# Patient Record
Sex: Female | Born: 1975 | Hispanic: No | Smoking: Never smoker
Health system: Southern US, Community
[De-identification: ages and names within clinical notes are randomized; demographics above are authoritative.]

## PROBLEM LIST (undated history)

## (undated) DIAGNOSIS — F419 Anxiety disorder, unspecified: Secondary | ICD-10-CM

## (undated) DIAGNOSIS — R319 Hematuria, unspecified: Secondary | ICD-10-CM

## (undated) DIAGNOSIS — I729 Aneurysm of unspecified site: Secondary | ICD-10-CM

## (undated) DIAGNOSIS — I671 Cerebral aneurysm, nonruptured: Secondary | ICD-10-CM

## (undated) HISTORY — PX: CHOLECYSTECTOMY: SHX55

## (undated) HISTORY — DX: Anxiety disorder, unspecified: F41.9

## (undated) HISTORY — PX: APPENDECTOMY: SHX54

## (undated) HISTORY — DX: Hematuria, unspecified: R31.9

## (undated) HISTORY — PX: BRAIN SURGERY: SHX531

## (undated) HISTORY — DX: Aneurysm of unspecified site: I72.9

---

## 1998-04-11 ENCOUNTER — Emergency Department (HOSPITAL_COMMUNITY): Admission: EM | Admit: 1998-04-11 | Discharge: 1998-04-11 | Payer: Self-pay | Admitting: Emergency Medicine

## 1998-06-20 ENCOUNTER — Emergency Department (HOSPITAL_COMMUNITY): Admission: EM | Admit: 1998-06-20 | Discharge: 1998-06-20 | Payer: Self-pay | Admitting: Emergency Medicine

## 1999-09-23 ENCOUNTER — Emergency Department (HOSPITAL_COMMUNITY): Admission: EM | Admit: 1999-09-23 | Discharge: 1999-09-23 | Payer: Self-pay | Admitting: Emergency Medicine

## 1999-12-18 ENCOUNTER — Other Ambulatory Visit: Admission: RE | Admit: 1999-12-18 | Discharge: 1999-12-18 | Payer: Self-pay | Admitting: *Deleted

## 2000-10-26 ENCOUNTER — Other Ambulatory Visit: Admission: RE | Admit: 2000-10-26 | Discharge: 2000-10-26 | Payer: Self-pay | Admitting: Obstetrics and Gynecology

## 2000-10-26 ENCOUNTER — Encounter (INDEPENDENT_AMBULATORY_CARE_PROVIDER_SITE_OTHER): Payer: Self-pay | Admitting: Specialist

## 2001-04-29 ENCOUNTER — Other Ambulatory Visit: Admission: RE | Admit: 2001-04-29 | Discharge: 2001-04-29 | Payer: Self-pay | Admitting: Family Medicine

## 2001-07-16 ENCOUNTER — Encounter: Payer: Self-pay | Admitting: Emergency Medicine

## 2001-07-16 ENCOUNTER — Inpatient Hospital Stay (HOSPITAL_COMMUNITY): Admission: EM | Admit: 2001-07-16 | Discharge: 2001-07-18 | Payer: Self-pay | Admitting: Emergency Medicine

## 2001-07-17 ENCOUNTER — Encounter: Payer: Self-pay | Admitting: Emergency Medicine

## 2001-07-19 ENCOUNTER — Ambulatory Visit (HOSPITAL_COMMUNITY): Admission: RE | Admit: 2001-07-19 | Discharge: 2001-07-19 | Payer: Self-pay | Admitting: Gastroenterology

## 2001-07-19 ENCOUNTER — Encounter: Payer: Self-pay | Admitting: Gastroenterology

## 2001-08-03 ENCOUNTER — Encounter (INDEPENDENT_AMBULATORY_CARE_PROVIDER_SITE_OTHER): Payer: Self-pay

## 2001-08-03 ENCOUNTER — Observation Stay (HOSPITAL_COMMUNITY): Admission: RE | Admit: 2001-08-03 | Discharge: 2001-08-04 | Payer: Self-pay | Admitting: *Deleted

## 2003-03-27 ENCOUNTER — Ambulatory Visit (HOSPITAL_COMMUNITY): Admission: RE | Admit: 2003-03-27 | Discharge: 2003-03-27 | Payer: Self-pay | Admitting: Obstetrics and Gynecology

## 2003-04-14 ENCOUNTER — Encounter (INDEPENDENT_AMBULATORY_CARE_PROVIDER_SITE_OTHER): Payer: Self-pay | Admitting: Specialist

## 2003-04-14 ENCOUNTER — Ambulatory Visit (HOSPITAL_COMMUNITY): Admission: RE | Admit: 2003-04-14 | Discharge: 2003-04-14 | Payer: Self-pay | Admitting: Obstetrics and Gynecology

## 2003-09-18 ENCOUNTER — Other Ambulatory Visit: Admission: RE | Admit: 2003-09-18 | Discharge: 2003-09-18 | Payer: Self-pay | Admitting: Obstetrics and Gynecology

## 2004-01-17 ENCOUNTER — Ambulatory Visit (HOSPITAL_COMMUNITY): Admission: AD | Admit: 2004-01-17 | Discharge: 2004-01-17 | Payer: Self-pay | Admitting: Obstetrics and Gynecology

## 2004-03-18 ENCOUNTER — Inpatient Hospital Stay (HOSPITAL_COMMUNITY): Admission: AD | Admit: 2004-03-18 | Discharge: 2004-03-21 | Payer: Self-pay | Admitting: Obstetrics and Gynecology

## 2004-03-19 ENCOUNTER — Encounter (INDEPENDENT_AMBULATORY_CARE_PROVIDER_SITE_OTHER): Payer: Self-pay | Admitting: Specialist

## 2004-03-21 ENCOUNTER — Inpatient Hospital Stay (HOSPITAL_COMMUNITY): Admission: AD | Admit: 2004-03-21 | Discharge: 2004-03-21 | Payer: Self-pay | Admitting: Obstetrics and Gynecology

## 2005-02-12 ENCOUNTER — Other Ambulatory Visit: Admission: RE | Admit: 2005-02-12 | Discharge: 2005-02-12 | Payer: Self-pay | Admitting: Obstetrics and Gynecology

## 2006-04-10 ENCOUNTER — Other Ambulatory Visit: Admission: RE | Admit: 2006-04-10 | Discharge: 2006-04-10 | Payer: Self-pay | Admitting: Obstetrics and Gynecology

## 2007-06-17 ENCOUNTER — Inpatient Hospital Stay (HOSPITAL_COMMUNITY): Admission: AD | Admit: 2007-06-17 | Discharge: 2007-06-17 | Payer: Self-pay | Admitting: Obstetrics and Gynecology

## 2007-06-17 ENCOUNTER — Ambulatory Visit (HOSPITAL_COMMUNITY): Admission: RE | Admit: 2007-06-17 | Discharge: 2007-06-17 | Payer: Self-pay | Admitting: Obstetrics and Gynecology

## 2007-06-21 ENCOUNTER — Ambulatory Visit (HOSPITAL_COMMUNITY): Admission: RE | Admit: 2007-06-21 | Discharge: 2007-06-21 | Payer: Self-pay | Admitting: Obstetrics and Gynecology

## 2007-06-24 ENCOUNTER — Ambulatory Visit (HOSPITAL_COMMUNITY): Admission: RE | Admit: 2007-06-24 | Discharge: 2007-06-24 | Payer: Self-pay | Admitting: Obstetrics and Gynecology

## 2007-07-01 ENCOUNTER — Ambulatory Visit (HOSPITAL_COMMUNITY): Admission: RE | Admit: 2007-07-01 | Discharge: 2007-07-01 | Payer: Self-pay | Admitting: Obstetrics and Gynecology

## 2007-07-08 ENCOUNTER — Ambulatory Visit (HOSPITAL_COMMUNITY): Admission: RE | Admit: 2007-07-08 | Discharge: 2007-07-08 | Payer: Self-pay | Admitting: Obstetrics and Gynecology

## 2007-07-15 ENCOUNTER — Ambulatory Visit (HOSPITAL_COMMUNITY): Admission: RE | Admit: 2007-07-15 | Discharge: 2007-07-15 | Payer: Self-pay | Admitting: Obstetrics and Gynecology

## 2007-07-21 ENCOUNTER — Ambulatory Visit (HOSPITAL_COMMUNITY): Admission: RE | Admit: 2007-07-21 | Discharge: 2007-07-21 | Payer: Self-pay | Admitting: Obstetrics and Gynecology

## 2007-07-22 ENCOUNTER — Inpatient Hospital Stay (HOSPITAL_COMMUNITY): Admission: AD | Admit: 2007-07-22 | Discharge: 2007-07-22 | Payer: Self-pay | Admitting: Obstetrics and Gynecology

## 2007-07-28 ENCOUNTER — Ambulatory Visit (HOSPITAL_COMMUNITY): Admission: RE | Admit: 2007-07-28 | Discharge: 2007-07-28 | Payer: Self-pay | Admitting: Obstetrics and Gynecology

## 2007-08-04 ENCOUNTER — Encounter: Payer: Self-pay | Admitting: Obstetrics and Gynecology

## 2007-08-04 ENCOUNTER — Inpatient Hospital Stay (HOSPITAL_COMMUNITY): Admission: AD | Admit: 2007-08-04 | Discharge: 2007-08-09 | Payer: Self-pay | Admitting: Obstetrics and Gynecology

## 2007-08-09 ENCOUNTER — Encounter: Payer: Self-pay | Admitting: Obstetrics and Gynecology

## 2007-08-13 ENCOUNTER — Inpatient Hospital Stay (HOSPITAL_COMMUNITY): Admission: RE | Admit: 2007-08-13 | Discharge: 2007-08-15 | Payer: Self-pay | Admitting: Obstetrics and Gynecology

## 2007-08-13 ENCOUNTER — Encounter (INDEPENDENT_AMBULATORY_CARE_PROVIDER_SITE_OTHER): Payer: Self-pay | Admitting: Obstetrics and Gynecology

## 2009-12-20 ENCOUNTER — Emergency Department (HOSPITAL_BASED_OUTPATIENT_CLINIC_OR_DEPARTMENT_OTHER): Admission: EM | Admit: 2009-12-20 | Discharge: 2009-12-20 | Payer: Self-pay | Admitting: Emergency Medicine

## 2010-01-01 ENCOUNTER — Encounter: Admission: RE | Admit: 2010-01-01 | Discharge: 2010-01-01 | Payer: Self-pay | Admitting: Family Medicine

## 2010-01-02 ENCOUNTER — Emergency Department (HOSPITAL_BASED_OUTPATIENT_CLINIC_OR_DEPARTMENT_OTHER): Admission: EM | Admit: 2010-01-02 | Discharge: 2010-01-02 | Payer: Self-pay | Admitting: Emergency Medicine

## 2010-01-02 ENCOUNTER — Ambulatory Visit: Payer: Self-pay | Admitting: Diagnostic Radiology

## 2010-01-03 ENCOUNTER — Encounter: Payer: Self-pay | Admitting: Family Medicine

## 2010-01-16 ENCOUNTER — Inpatient Hospital Stay (HOSPITAL_COMMUNITY): Admission: RE | Admit: 2010-01-16 | Discharge: 2010-01-17 | Payer: Self-pay | Admitting: Interventional Radiology

## 2010-01-30 ENCOUNTER — Encounter: Payer: Self-pay | Admitting: Interventional Radiology

## 2010-03-11 ENCOUNTER — Ambulatory Visit (HOSPITAL_COMMUNITY): Admission: RE | Admit: 2010-03-11 | Discharge: 2010-03-12 | Payer: Self-pay | Admitting: Interventional Radiology

## 2010-03-25 ENCOUNTER — Encounter: Payer: Self-pay | Admitting: Interventional Radiology

## 2010-04-05 ENCOUNTER — Ambulatory Visit: Payer: Self-pay | Admitting: Psychiatry

## 2010-04-05 ENCOUNTER — Inpatient Hospital Stay (HOSPITAL_COMMUNITY): Admission: RE | Admit: 2010-04-05 | Discharge: 2010-04-09 | Payer: Self-pay | Admitting: Psychiatry

## 2010-06-12 ENCOUNTER — Ambulatory Visit (HOSPITAL_COMMUNITY)
Admission: RE | Admit: 2010-06-12 | Discharge: 2010-06-12 | Payer: Self-pay | Source: Home / Self Care | Admitting: Psychiatry

## 2010-06-14 ENCOUNTER — Emergency Department (HOSPITAL_COMMUNITY): Admission: EM | Admit: 2010-06-14 | Discharge: 2010-06-14 | Payer: Self-pay | Admitting: Emergency Medicine

## 2010-06-17 ENCOUNTER — Other Ambulatory Visit (HOSPITAL_COMMUNITY): Admission: RE | Admit: 2010-06-17 | Discharge: 2010-06-28 | Payer: Self-pay | Admitting: Psychiatry

## 2010-06-27 ENCOUNTER — Ambulatory Visit: Payer: Self-pay | Admitting: Psychiatry

## 2010-07-01 ENCOUNTER — Emergency Department (HOSPITAL_COMMUNITY)
Admission: EM | Admit: 2010-07-01 | Discharge: 2010-07-02 | Payer: Self-pay | Source: Home / Self Care | Admitting: Emergency Medicine

## 2010-07-01 ENCOUNTER — Ambulatory Visit (HOSPITAL_COMMUNITY)
Admission: RE | Admit: 2010-07-01 | Discharge: 2010-07-01 | Payer: Self-pay | Source: Home / Self Care | Admitting: Psychiatry

## 2010-07-02 DIAGNOSIS — F331 Major depressive disorder, recurrent, moderate: Secondary | ICD-10-CM

## 2010-07-16 ENCOUNTER — Ambulatory Visit (HOSPITAL_COMMUNITY): Admission: RE | Admit: 2010-07-16 | Discharge: 2010-07-16 | Payer: Self-pay | Admitting: Interventional Radiology

## 2010-09-15 ENCOUNTER — Encounter: Payer: Self-pay | Admitting: Obstetrics and Gynecology

## 2010-10-12 ENCOUNTER — Emergency Department (HOSPITAL_COMMUNITY)
Admission: EM | Admit: 2010-10-12 | Discharge: 2010-10-12 | Disposition: A | Discharge status: Home / Self Care | Payer: BC Managed Care – PPO | Attending: Emergency Medicine | Admitting: Emergency Medicine

## 2010-10-12 ENCOUNTER — Emergency Department (HOSPITAL_COMMUNITY): Payer: BC Managed Care – PPO

## 2010-10-12 DIAGNOSIS — N3010 Interstitial cystitis (chronic) without hematuria: Secondary | ICD-10-CM | POA: Insufficient documentation

## 2010-10-12 DIAGNOSIS — I671 Cerebral aneurysm, nonruptured: Secondary | ICD-10-CM | POA: Insufficient documentation

## 2010-10-12 DIAGNOSIS — R5381 Other malaise: Secondary | ICD-10-CM | POA: Insufficient documentation

## 2010-10-12 DIAGNOSIS — G43909 Migraine, unspecified, not intractable, without status migrainosus: Secondary | ICD-10-CM | POA: Insufficient documentation

## 2010-10-12 DIAGNOSIS — F411 Generalized anxiety disorder: Secondary | ICD-10-CM | POA: Insufficient documentation

## 2010-10-12 DIAGNOSIS — J45909 Unspecified asthma, uncomplicated: Secondary | ICD-10-CM | POA: Insufficient documentation

## 2010-10-12 DIAGNOSIS — R5383 Other fatigue: Secondary | ICD-10-CM | POA: Insufficient documentation

## 2010-10-12 DIAGNOSIS — Z79899 Other long term (current) drug therapy: Secondary | ICD-10-CM | POA: Insufficient documentation

## 2010-10-12 LAB — URINALYSIS, ROUTINE W REFLEX MICROSCOPIC
Ketones, ur: NEGATIVE mg/dL
Nitrite: NEGATIVE
Protein, ur: NEGATIVE mg/dL
Specific Gravity, Urine: 1.01 (ref 1.005–1.030)
Urine Glucose, Fasting: NEGATIVE mg/dL
pH: 7.5 (ref 5.0–8.0)

## 2010-10-12 LAB — COMPREHENSIVE METABOLIC PANEL
ALT: 15 U/L (ref 0–35)
Albumin: 3.3 g/dL — ABNORMAL LOW (ref 3.5–5.2)
BUN: 7 mg/dL (ref 6–23)
Chloride: 114 mEq/L — ABNORMAL HIGH (ref 96–112)
Creatinine, Ser: 0.77 mg/dL (ref 0.4–1.2)
GFR calc non Af Amer: >60 mL/min (ref >60)
Glucose, Bld: 106 mg/dL — ABNORMAL HIGH (ref 70–99)
Potassium: 3.6 mEq/L (ref 3.5–5.1)
Total Bilirubin: 0.8 mg/dL (ref 0.3–1.2)

## 2010-10-12 LAB — DIFFERENTIAL
Basophils Relative: 0 % (ref 0–1)
Eosinophils Absolute: 0.1 10*3/uL (ref 0.0–0.7)
Eosinophils Relative: 2 % (ref 0–5)
Lymphs Abs: 1.6 10*3/uL (ref 0.7–4.0)
Neutro Abs: 4.6 10*3/uL (ref 1.7–7.7)
Neutrophils Relative %: 69 % (ref 43–77)

## 2010-10-12 LAB — POCT I-STAT, CHEM 8
Calcium, Ion: 1.15 mmol/L (ref 1.12–1.32)
Chloride: 109 mEq/L (ref 96–112)
Creatinine, Ser: 0.9 mg/dL (ref 0.4–1.2)
HCT: 34 % — ABNORMAL LOW (ref 36.0–46.0)
Hemoglobin: 11.6 g/dL — ABNORMAL LOW (ref 12.0–15.0)
Potassium: 3.6 mEq/L (ref 3.5–5.1)
Sodium: 142 mEq/L (ref 135–145)
TCO2: 19 mmol/L (ref 0–100)

## 2010-10-12 LAB — URINE MICROSCOPIC-ADD ON

## 2010-10-12 LAB — RAPID URINE DRUG SCREEN, HOSP PERFORMED
Amphetamines: NOT DETECTED
Tetrahydrocannabinol: NOT DETECTED

## 2010-10-12 LAB — POCT PREGNANCY, URINE: Preg Test, Ur: NEGATIVE

## 2010-10-12 LAB — CBC
MCHC: 33.8 g/dL (ref 30.0–36.0)
Platelets: 219 10*3/uL (ref 150–400)
RBC: 3.77 MIL/uL — ABNORMAL LOW (ref 3.87–5.11)
RDW: 13 % (ref 11.5–15.5)

## 2010-10-12 LAB — ACETAMINOPHEN LEVEL: Acetaminophen (Tylenol), Serum: <10 ug/mL — ABNORMAL LOW (ref 10–30)

## 2010-11-05 LAB — PROTIME-INR: Prothrombin Time: 14 seconds (ref 11.6–15.2)

## 2010-11-05 LAB — APTT: aPTT: 30 s (ref 24–37)

## 2010-11-05 LAB — BASIC METABOLIC PANEL WITH GFR
BUN: 9 mg/dL (ref 6–23)
CO2: 20 meq/L (ref 19–32)
Calcium: 9.8 mg/dL (ref 8.4–10.5)
Chloride: 107 meq/L (ref 96–112)
Creatinine, Ser: 0.8 mg/dL (ref 0.4–1.2)
GFR calc non Af Amer: >60 mL/min
Glucose, Bld: 143 mg/dL — ABNORMAL HIGH (ref 70–99)
Potassium: 3.5 meq/L (ref 3.5–5.1)
Sodium: 138 meq/L (ref 135–145)

## 2010-11-05 LAB — CBC
Hemoglobin: 13.7 g/dL (ref 12.0–15.0)
MCH: 30.2 pg (ref 26.0–34.0)
MCHC: 34.2 g/dL (ref 30.0–36.0)
MCV: 89.2 fL (ref 78.0–100.0)
MCV: 86.5 fL (ref 78.0–100.0)
RBC: 4.25 MIL/uL (ref 3.87–5.11)
RBC: 4.53 MIL/uL (ref 3.87–5.11)
RDW: 13.6 % (ref 11.5–15.5)
RDW: 13.2 % (ref 11.5–15.5)
WBC: 8.2 10*3/uL (ref 4.0–10.5)

## 2010-11-05 LAB — RAPID URINE DRUG SCREEN, HOSP PERFORMED
Amphetamines: NOT DETECTED
Barbiturates: NOT DETECTED
Benzodiazepines: NOT DETECTED
Cocaine: NOT DETECTED
Opiates: NOT DETECTED
Tetrahydrocannabinol: NOT DETECTED

## 2010-11-05 LAB — POCT PREGNANCY, URINE: Preg Test, Ur: NEGATIVE

## 2010-11-05 LAB — BASIC METABOLIC PANEL
Calcium: 9.2 mg/dL (ref 8.4–10.5)
GFR calc non Af Amer: >60 mL/min (ref >60)

## 2010-11-05 LAB — ETHANOL

## 2010-11-06 LAB — POCT I-STAT, CHEM 8
BUN: 7 mg/dL (ref 6–23)
Calcium, Ion: 1.16 mmol/L (ref 1.12–1.32)
HCT: 38 % (ref 36.0–46.0)
Hemoglobin: 12.9 g/dL (ref 12.0–15.0)
Sodium: 141 mEq/L (ref 135–145)
TCO2: 26 mmol/L (ref 0–100)

## 2010-11-08 LAB — COMPREHENSIVE METABOLIC PANEL
ALT: 22 U/L (ref 0–35)
Albumin: 4.1 g/dL (ref 3.5–5.2)
BUN: 14 mg/dL (ref 6–23)
Calcium: 9.2 mg/dL (ref 8.4–10.5)
Glucose, Bld: 91 mg/dL (ref 70–99)
Potassium: 3.9 mEq/L (ref 3.5–5.1)
Sodium: 140 mEq/L (ref 135–145)
Total Protein: 7.2 g/dL (ref 6.0–8.3)

## 2010-11-08 LAB — CBC
HCT: 38.7 % (ref 36.0–46.0)
MCHC: 34.3 g/dL (ref 30.0–36.0)
Platelets: 271 10*3/uL (ref 150–400)
RDW: 12.9 % (ref 11.5–15.5)
WBC: 6.4 10*3/uL (ref 4.0–10.5)

## 2010-11-08 LAB — ETHANOL: Alcohol, Ethyl (B): <5 mg/dL (ref 0–10)

## 2010-11-08 LAB — TSH: TSH: 1.367 u[IU]/mL (ref 0.350–4.500)

## 2010-11-09 LAB — CBC
HCT: 27.8 % — ABNORMAL LOW (ref 36.0–46.0)
Hemoglobin: 9.4 g/dL — ABNORMAL LOW (ref 12.0–15.0)
RBC: 3.02 MIL/uL — ABNORMAL LOW (ref 3.87–5.11)

## 2010-11-09 LAB — BASIC METABOLIC PANEL
Calcium: 7.4 mg/dL — ABNORMAL LOW (ref 8.4–10.5)
GFR calc Af Amer: >60 mL/min (ref >60)
GFR calc non Af Amer: >60 mL/min (ref >60)
Potassium: 3.7 mEq/L (ref 3.5–5.1)
Sodium: 138 mEq/L (ref 135–145)

## 2010-11-09 LAB — PROTIME-INR
INR: 1.2 (ref 0.00–1.49)
Prothrombin Time: 15.1 seconds (ref 11.6–15.2)

## 2010-11-09 LAB — APTT: aPTT: 50 seconds — ABNORMAL HIGH (ref 24–37)

## 2010-11-09 LAB — HEPARIN LEVEL (UNFRACTIONATED): Heparin Unfractionated: 0.15 IU/mL — ABNORMAL LOW (ref 0.30–0.70)

## 2010-11-10 LAB — DIFFERENTIAL
Basophils Relative: 0 % (ref 0–1)
Lymphocytes Relative: 38 % (ref 12–46)
Lymphs Abs: 2.4 10*3/uL (ref 0.7–4.0)
Monocytes Relative: 6 % (ref 3–12)
Neutro Abs: 3.5 10*3/uL (ref 1.7–7.7)
Neutrophils Relative %: 54 % (ref 43–77)

## 2010-11-10 LAB — APTT: aPTT: 33 seconds (ref 24–37)

## 2010-11-10 LAB — BASIC METABOLIC PANEL
Calcium: 9.1 mg/dL (ref 8.4–10.5)
GFR calc Af Amer: >60 mL/min (ref >60)
GFR calc non Af Amer: >60 mL/min (ref >60)
Sodium: 139 mEq/L (ref 135–145)

## 2010-11-10 LAB — CBC
Hemoglobin: 13 g/dL (ref 12.0–15.0)
MCHC: 34.6 g/dL (ref 30.0–36.0)
RBC: 4.13 MIL/uL (ref 3.87–5.11)
WBC: 6.4 10*3/uL (ref 4.0–10.5)

## 2010-11-10 LAB — PROTIME-INR: INR: 1.14 (ref 0.00–1.49)

## 2010-11-11 LAB — BASIC METABOLIC PANEL
BUN: 8 mg/dL (ref 6–23)
CO2: 28 mEq/L (ref 19–32)
Calcium: 7.9 mg/dL — ABNORMAL LOW (ref 8.4–10.5)
Calcium: 9 mg/dL (ref 8.4–10.5)
Creatinine, Ser: 0.58 mg/dL (ref 0.4–1.2)
Creatinine, Ser: 0.62 mg/dL (ref 0.4–1.2)
GFR calc Af Amer: >60 mL/min (ref >60)
GFR calc Af Amer: >60 mL/min (ref >60)
Glucose, Bld: 83 mg/dL (ref 70–99)

## 2010-11-11 LAB — DIFFERENTIAL
Basophils Absolute: 0 10*3/uL (ref 0.0–0.1)
Basophils Relative: 0 % (ref 0–1)
Neutro Abs: 4.5 10*3/uL (ref 1.7–7.7)
Neutrophils Relative %: 59 % (ref 43–77)

## 2010-11-11 LAB — PROTIME-INR
INR: 1.13 (ref 0.00–1.49)
INR: 1.32 (ref 0.00–1.49)
Prothrombin Time: 14.4 seconds (ref 11.6–15.2)
Prothrombin Time: 16.3 seconds — ABNORMAL HIGH (ref 11.6–15.2)

## 2010-11-11 LAB — CBC
MCHC: 33.7 g/dL (ref 30.0–36.0)
Platelets: 198 10*3/uL (ref 150–400)
RBC: 3.35 MIL/uL — ABNORMAL LOW (ref 3.87–5.11)
RBC: 4.08 MIL/uL (ref 3.87–5.11)
RDW: 13.5 % (ref 11.5–15.5)
WBC: 13.3 10*3/uL — ABNORMAL HIGH (ref 4.0–10.5)

## 2010-11-12 LAB — BASIC METABOLIC PANEL
Calcium: 8.8 mg/dL (ref 8.4–10.5)
Creatinine, Ser: 0.7 mg/dL (ref 0.4–1.2)
GFR calc Af Amer: >60 mL/min (ref >60)
GFR calc non Af Amer: >60 mL/min (ref >60)
Glucose, Bld: 103 mg/dL — ABNORMAL HIGH (ref 70–99)
Sodium: 143 mEq/L (ref 135–145)

## 2010-11-12 LAB — CBC
Hemoglobin: 13 g/dL (ref 12.0–15.0)
MCHC: 34.3 g/dL (ref 30.0–36.0)
RBC: 4.2 MIL/uL (ref 3.87–5.11)
RDW: 12.2 % (ref 11.5–15.5)

## 2010-11-12 LAB — DIFFERENTIAL
Basophils Absolute: 0 10*3/uL (ref 0.0–0.1)
Basophils Relative: 0 % (ref 0–1)
Lymphocytes Relative: 29 % (ref 12–46)
Monocytes Absolute: 0.4 10*3/uL (ref 0.1–1.0)
Neutro Abs: 3.9 10*3/uL (ref 1.7–7.7)
Neutrophils Relative %: 62 % (ref 43–77)

## 2010-11-12 LAB — PROTIME-INR
INR: 1.02 (ref 0.00–1.49)
Prothrombin Time: 13.3 seconds (ref 11.6–15.2)

## 2010-11-12 LAB — HEMOGLOBIN AND HEMATOCRIT, BLOOD
HCT: 36.4 % (ref 36.0–46.0)
Hemoglobin: 12.6 g/dL (ref 12.0–15.0)

## 2010-11-12 LAB — PREGNANCY, URINE
Preg Test, Ur: NEGATIVE
Preg Test, Ur: NEGATIVE

## 2010-11-12 LAB — APTT: aPTT: 31 seconds (ref 24–37)

## 2011-01-10 NOTE — Discharge Summary (Signed)
NAME:  Katherine [name], Katherine [name]              ACCOUNT NO.:  [id number]   MEDICAL RECORD NO.:  [id number]          PATIENT TYPE:  INP   LOCATION:  9106                          FACILITY:  [facility]   PHYSICIAN:  Huel Cote, M.D. DATE OF BIRTH:  1975/09/23   DATE OF ADMISSION:  08/13/2007  DATE OF DISCHARGE:  08/15/2007                               DISCHARGE SUMMARY   DISCHARGE DIAGNOSES:  1. Term pregnancy at 37 weeks delivered.  2. Preeclampsia and asymmetrical intrauterine growth retardation.  3. Status post induction and delivery of infant.   DISCHARGE MEDICATIONS:  Motrin 600 mg p.o. q.6 h.   DISCHARGE FOLLOWUP:  The patient is to followup in the office for her  routine postpartum exam.   HOSPITAL COURSE:  The patient is 35 year old G3, P1-0-1-1 who was  admitted at 32 weeks' gestation with preeclampsia and findings  consistent with a fetal growth lag.  The patient had been being followed  by significant proteinuria in her urine with 24-hour urine 800 mg on  December 10 and 1400 mg on December 15.  She had been followed closely  with labs, blood pressures, and fetal monitoring; and down that she has  reached 37 weeks it is felt by maternal fetal medicine all involved that  delivery is prudent; and that the patient had no current PIH symptoms,  although she had prior to admission had some nausea and vomiting  intermittently.   PRENATAL LABS AS FOLLOWS:  A+ antibody negative, rubella equivocal, HIV  negative, hepatitis B surface antigen negative, RPR negative, GC  negative, chlamydia negative, group B strep negative.  A 1-hour Glucola  131.   PAST OBSTETRICAL HISTORY:  She had a vaginal delivery of a 6 pounds 4  ounce infant in 2005 with preeclampsia at 37 weeks, in 2004 a  miscarriage.   PAST GYN HISTORY:  None.   PAST MEDICAL HISTORY:  1,  History of anxiety.  1. Interstitial cystitis.  2. Migraines.   PAST SURGICAL HISTORY:  1. Tonsillectomy.  2. Appendectomy.  3.  Cholecystectomy.   ALLERGIES:  Multiple.   MEDICATIONS:  Prenatal vitamins and Zantac.   HOSPITAL COURSE:  On admission blood pressure was stable at 120/70.  PIH  labs were normal.  Cervix was 2 to 3 at a -2 station.  She had rupture  of membranes performed with clear fluid noted.  Last fetal ultrasound  had showed a growth of approximately 17th percentile.  She progressed in  labor, received an epidural, and pushed well with a normal spontaneous  vaginal delivery of a vigorous female infant, Apgars were 7 and 9, and  weight was 5 pounds 2 ounces.  Infant had a good spontaneous cry;  however, did develop some retractions and slightly decreased tone; and,  therefore, was taken to the observational nursery.   Placenta was slightly adherent and was delivered spontaneously, but some  trailing fragments were removed manually after delivery.  The patient  had a small hymenal ring defect which had been bothersome to her with  intercourse.  This was trimmed opened and two interrupted 2-0 Vicryl  sutures were placed  for hemostasis.  There is a small laceration under  the clitoris which was repaired with 3-0 interrupted suture for  hemostasis also.  There was no further bleeding after initial bleeding  from the placenta which was 400-500 mL.  Cervix and rectum were intact.  She was then admitted for routine postpartum care.  The hemoglobin was  noted to be 9.9.  The baby did have to remain in the NICU secondary to  turning dusky with feedings, and had an open soft palate noted; however,  was stable.   The patient on postpartum day #2 was felt stable for discharge home.  She was given emotional support regarding the baby remaining in the  NICU, and was instructed to call with any increase in postpartum  depression.      Huel Cote, M.D.  Electronically Signed     KR/MEDQ  D:  10/05/2007  T:  10/06/2007  Job:  [id number]

## 2011-01-10 NOTE — H&P (Signed)
NAME:  Katherine [name], Katherine [name]                        ACCOUNT NO.:  [id number]   MEDICAL RECORD NO.:  [id number]                   PATIENT TYPE:  AMB   LOCATION:  SDC                                  FACILITY:  [facility]   PHYSICIAN:  Huel Cote, M.D.              DATE OF BIRTH:  1976-07-15   DATE OF ADMISSION:  03/28/2003  DATE OF DISCHARGE:                                HISTORY & PHYSICAL   HISTORY OF PRESENT ILLNESS:  The patient is a 35 year old G1, P0 with a LMP  of February 01, 2003 who has been followed with first trimester bleeding and  serial beta HCG's as well as ultrasounds. The patient at 7 weeks had an  ultrasound with a 3 cm gestational sac noted but no fetal pole within it,  consistent with a blighted ovum. Given all options of expected management  versus preceding with a D&C, the patient wishes to proceed with a D&C.   PAST MEDICAL HISTORY:  Significant for migraine headaches and allergies.   PAST SURGICAL HISTORY:  Cholecystectomy in 2002. Appendectomy in 1996.  Tonsillectomy in 1980 and an ovarian cystectomy in 1992 and 1993.   PAST GYNECOLOGIC HISTORY:  No abnormal pap smears.  Her menstrual periods  have been irregular, approximately every 14 days in April, May and June.  However, had spread out to every 30 days for her last cycle.   ALLERGIES:  The patient is allergic to multiple medications including ZOMIG,  PERCOCET, CODEINE, HYDROCODONE, ULTRAM, DITROPAN, URISED AND PYRIDIUM.   PHYSICAL EXAMINATION:  HEENT: No abnormalities.  CARDIAC: Regular rate and rhythm.  LUNGS: Clear.  ABDOMEN: Soft and nontender.  PELVIC: On vaginal examination, she has normal external genitalia. Her  uterus size is consistent with 7-8 weeks and her cervical os is closed.   LABORATORY DATA:  The patient's beta HCG's have been drawn and followed on  March 03, 2003. She had a progesterone of 10.8 and a beta HCG of 646. On March 06, 2003 she had a followup beta of 3,505 and on March 13, 2003,  she had a  beta of 8,910. Beta drawn just prior to surgery was 43,000 and an ultrasound  revealed no fetal pole and an MD gestational sac.   HOSPITAL COURSE:  The patient was counseled of the risks and benefits of  proceeding with surgery including bleeding and possible uterine perforation.  The patient understands these risks. However, desires to proceed with  surgery as stated. Blood type confirms that she is Rh positive and her blood  count is stable.                                               Huel Cote, M.D.    KR/MEDQ  D:  03/27/2003  T:  03/27/2003  Job:  [id number]  

## 2011-01-10 NOTE — H&P (Signed)
[facility]  Patient:    Katherine [name], Katherine [name] Visit Number: [id number] MRN: [id number]          Service Type: MED Location: 3W 0375 01 Attending Physician:  Starr Sinclair Dictated by:   Sammuel Cooper, P.A. Admit Date:  07/16/2001   CC:         Katherine [name], M.D. [facility]   History and Physical  CHIEF COMPLAINT:  Acute abdominal pain, nausea and vomiting, and diarrhea.  HISTORY OF PRESENT ILLNESS:  Katherine [name] is a 35 year old white female with history of migraine headaches.  She also has had a history of ovarian cyst and required a left ovarian cystectomy in the 1980s.  She has been on long-term birth control pills since that time due to recurrent cysts.  She is status post appendectomy in 1992 which was done laparoscopically and has history of urethral stricture.  The patient reports intermittent episodes of nausea and vomiting and abdominal pain occurring every three to four months over the past several years.  She says she is generally sick about 24 hours with these episodes and then has gradual resolution over a couple of days.  The episodes are often associated with diarrhea.  The patient had a gallbladder workup about five months ago at [facility] with ultrasound and HIDA scan, both reportedly negative.  She says she feels fine in between these episodes.  She had onset approximately one week ago with nausea and vomiting which woke her up one night.  She reports that every morning since that time she has had nausea and has spit up a bit after getting up in the morning.  She says her bowel movements have been normal.  She then had onset of mid back pain bilaterally yesterday which was somewhat progressive.  Then last evening about 12:30 a.m. woke up with nausea and vomiting, had multiple episodes of vomiting, and onset of epigastric pain which has been constant.  This was associated with sweats and feeling of being hot and cold.   She had no documented temperature at home.  She did have diarrhea after coming to the emergency room for several episodes which has since subsided.  She has not noted any melena or hematochezia.  She has been evaluated in the emergency room per Dr. Beverely Pace today.  A noncontrasted CT was done for renal stone protocol secondary to finding of microscopic hematuria.  CT was negative for stone and there was a question of a soft tissue density in the area of the appendix, and even question of appendicitis; however, she is status post appendectomy.  LABORATORY:  WBC 10.8, hemoglobin 13.1, hematocrit 40.9, MCV 87.  Urine pregnancy test was negative.  Amylase 61, lipase 24.  Urinalysis showed 7-10 RBCs per high-powered field.  BMET unremarkable.  Total bilirubin 1.3, indirect bilirubin 1.1, SGOT 19, SGPT 16.  The patient is admitted at this time with persistent pain for further diagnostic evaluation and pain control.  CURRENT MEDICATIONS: 1. Verapamil 240 p.o. q.d. 2. Zyrtec 10 q.d. 3. Maxalt p.r.n. migraine. 4. Ketoprofen p.r.n. migraine. 5. Flonase nasal spray 1 spray each nostril q.d. 6. She takes an allergy injection q. week. 7. She is on birth control pills daily with a 21-day cycle with withdrawal    bleeding every three months.  ALLERGIES:  IODINE intolerant with contrast for a previous IVP with nausea and vomiting.  Intolerant to CODEINE, URISED, DITROPAN, PERDIEM, and ZOMIG with various reactions.  PAST MEDICAL HISTORY:  As outlined  above.  FAMILY HISTORY:  Pertinent for rotten gallbladders with gallbladder disease in mother and grandmother.  A grandfather with colitis which on further questioning is probably irritable bowel.  SOCIAL HISTORY:  The patient is married.  She is employed as a Runner, broadcasting/film/video.  She says she is hoping to start a family soon and was planning on coming off of birth control pills.  No tobacco and no ETOH.  REVIEW OF SYSTEMS:  CARDIOVASCULAR:  Negative.   PULMONARY:  Negative. GENITOURINARY:  Negative.  GI:  As above.  GYN:  The patient reports scant amount of vaginal bleeding yesterday.  She is at the beginning of her cycle with her pack of birth control pills and would not be expecting to have another menstrual cycle for three more weeks.  She is currently on a 21-day pack of birth control pills and only has a period every three months when she has withdrawal bleeding for one week.  PHYSICAL EXAMINATION:  VITAL SIGNS:  Temperature 99.4 on admission and 100.3 later this morning. Blood pressure 97/60, pulse 80s, and O2 saturation 97.  GENERAL:  Well-developed white female, ill-appearing, in no acute distress.  HEENT:  Normocephalic, atraumatic.  EOMI.  PERRLA.  Sclerae anicteric.  HEART:  Regular rate and rhythm with S1, S2.  PULMONARY:  Clear to auscultation and percussion.  ABDOMEN:  Soft.  She is tender in the epigastrium, right upper quadrant, and right mid quadrant.  There is no guarding or rebound, no mass or hepatosplenomegaly.  Bowel sounds present.  RECTAL:  Heme-negative per ER physician, not repeated.  EXTREMITIES:  Without clubbing, cyanosis, or edema.  IMPRESSION: 26. A 35 year old white female with recurrent episodes of abdominal pain and    nausea and vomiting.  Now with acute upper abdominal pain, nausea and    vomiting, and fever, etiology not clear.  Question soft tissue inflammatory    density on noncontrasted CT scan in right lower quadrant. 2. Status post appendectomy. 3. Status post ovarian cystectomy on left, remote. 4. History of migraines. 5. Allergies. 6. Microscopic hematuria with negative CT with stone protocol.  PLAN:  The patient is admitted to the service of Judie Petit T. Pleas Koch., M.D. for intravenous fluid hydration, pain control, antiemetics.  Will proceed with upper endoscopy later this afternoon for further diagnostic evaluation.  If this is negative, plan contrasted CT scan of the abdomen  and pelvis and then  if unrevealing, fat stimulated HIDA scan and further workup depending on results of above. Dictated by:   Sammuel Cooper, P.A. Attending Physician:  Starr Sinclair DD:  07/16/01 TD:  07/16/01 Job: [id number]

## 2011-01-10 NOTE — Discharge Summary (Signed)
NAME:  Katherine [name], Katherine [name]              ACCOUNT NO.:  [id number]   MEDICAL RECORD NO.:  [id number]          PATIENT TYPE:  INP   LOCATION:  9158                          FACILITY:  [facility]   PHYSICIAN:  Huel Cote, M.D. DATE OF BIRTH:  [birth year]   DATE OF ADMISSION:  08/04/2007  DATE OF DISCHARGE:  08/09/2007                               DISCHARGE SUMMARY   DISCHARGE DIAGNOSES:  1. Preterm pregnancy at 35-3/7 weeks undelivered.  2. Severe proteinuria with suspected preeclampsia.  3. Some asymmetrical growth restriction.  4. Preterm contractions.   DISCHARGE MEDICATIONS:  None   HOSPITAL COURSE:  The patient is a 35 year old G3, P 1-0-1-1 who was  admitted 35-3/[redacted] weeks gestation for a complaint of nausea and vomiting  with lower abdominal pain.  The patient had been being followed  carefully with a history of severe preeclampsia with her first pregnancy  and on this pregnancy was noted to have a 4+ proteinuria on dipstick,  blood pressures were reasonable at 120-130/70s-80s.  The patient denied  any headache.  Prenatal care had been complicated by asymmetrical growth  restriction with a significant abdominal circumference lag.  The patient  had been followed by Dopplers and biophysical profiles.  She also had  had some preterm labor which was treated with terbutaline and decreased  activity.  Her cervix was 1-2 cm dilated at that point.   PRENATAL LABORATORY DATA:  A negative, antibody negative, RPR  nonreactive, rubella equivocal, hepatitis B surface antigen negative,  HIV nonreactive, GC negative, chlamydia negative, group B strep  negative.   PAST OBSTETRICAL HISTORY:  1. In 2004 she had a miscarriage.  2. In 2005 she had a vaginal delivery of a 6 pound 4 ounce infant at      37 weeks with severe preeclampsia.   PAST GYNECOLOGICAL HISTORY:  No abnormal Pap smears.   PAST SURGICAL HISTORY:  1. Tonsillectomy.  2. Cholecystectomy.  3. Appendectomy.   PAST MEDICAL HISTORY:   Significant for  1. Irritable bowel syndrome.  2. Migraines.  3. Asthma.  4. Interstitial cystitis.  5. Anxiety.   On admission she was afebrile and blood pressure was stable at 130/80.  Cardiac was regular rate and rhythm.  Lungs were clear.  Abdomen was  gravid and nontender.  DTRs were normal at 1 to 2+ and edema was 1 to 2+  also.  Platelets were over 200,000.  Uric acid 4.2.  LFTs were normal at  22 and 13 and creatinine was 0.5.  She was brought in house and begun on  a 24-hour urine with blood pressure observation.  Her symptoms improved  and her fetal monitoring remained stable.  She did have some days on her  admission where she felt some nausea and mild epigastric pain.  Her 24-  hour urine returned with 819 mg of protein, significant with probably  mild to moderate preeclampsia.  Blood pressures remained normal.  After  discussion with maternal fetal medicine, it was agreed that the patient  should remain in-house which she did.  She was followed closely with pre-  eclamptic labs and fetal  monitoring and after five days of admission,  had a repeat 24-hour urine which did increase to 1400 mg of protein,  however, her labs remained completely stable.  The plan with maternal  fetal medicine was made to deliver her at 37 weeks unless any other  symptoms or changes occurred in her status other than the severe  proteinuria and it was felt that with close observation, she could be  safely followed at home with intense monitoring in the office.  She was  therefore discharged to home with follow-up in the office planned within  48 hours and will have repeat labs and monitoring at that time.      Huel Cote, M.D.  Electronically Signed     KR/MEDQ  D:  10/04/2007  T:  10/06/2007  Job:  [id number]

## 2011-01-10 NOTE — Op Note (Signed)
NAME:  Katherine [name], Katherine [name]                        ACCOUNT NO.:  [id number]   MEDICAL RECORD NO.:  [id number]                   PATIENT TYPE:  AMB   LOCATION:  DAY                                  FACILITY:  [facility]   PHYSICIAN:  Huel Cote, M.D.              DATE OF BIRTH:  09-13-1975   DATE OF PROCEDURE:  04/14/2003  DATE OF DISCHARGE:                                 OPERATIVE REPORT   PREOPERATIVE DIAGNOSIS:  Missed abortion at about eight weeks gestation.   POSTOPERATIVE DIAGNOSIS:  Missed abortion at about eight weeks gestation.   PROCEDURE:  Suction, dilation, and curettage.   SURGEON:  Huel Cote, M.D.   ANESTHESIA:  MAC.   FLUIDS:  1. Estimated blood loss 50 mL.  2. Urine output 100 mL clear straight cath prior to procedure.   FINDINGS:  Uterus was approximately 7-8 weeks in size.  Cervix was partially  dilated prior to procedure.  There were minimum to moderate products of  conception obtained.   DESCRIPTION OF PROCEDURE:  The patient was taken to the operating room where  [facility] anesthesia was obtained without difficulty.  She was then prepped and  draped in the normal sterile fashion in the dorsal lithotomy position.  A  speculum was placed within the vagina and the cervix identified, grasped  with a single-tooth tenaculum, and injected with 1% plain lidocaine in a  local block.  A uterine sound was then introduced easily into the uterine  cavity and sounded to approximately 8 cm.  Again, the cervix was noted to be  partially dilated, and the dilators passed easily up to a maximum of 27.  An  8 mm suction curettage was then placed within the uterine cavity and, in two  passes, a minimal to moderate amount of products of conception were  obtained.  The suction was passed twice more with very little tissue  obtained.  Therefore, suction was discontinued, and sharp curettage was  performed of the uterine cavity.  There was some soft tissue felt in the  upper  right fundus which was removed partially with sharp curettage.  Therefore the suction was introduced twice more, and a small amount of  tissue obtained; however, no further substantial products of conception were  noted.  The patient had no bleeding at this point, and all instruments and  sponges were removed from the vagina.  One small area of bleeding on the  anterior cervix at the tenaculum site was controlled with a ring forcep and  was hemostatic at the conclusion of the procedure.  Sponge, lap, and needle  counts were correct x 2, and the patient was taken to the recovery room  awakened and in stable condition.  Huel Cote, M.D.    KR/MEDQ  D:  04/14/2003  T:  04/14/2003  Job:  [id number]

## 2011-01-10 NOTE — Discharge Summary (Signed)
NAME:  Katherine [name], Katherine [name]                        ACCOUNT NO.:  [id number]   MEDICAL RECORD NO.:  [id number]                   PATIENT TYPE:  INP   LOCATION:  9104                                 FACILITY:  [facility]   PHYSICIAN:  Huel Cote, M.D.              DATE OF BIRTH:  1976-05-21   DATE OF ADMISSION:  03/18/2004  DATE OF DISCHARGE:  03/21/2004                                 DISCHARGE SUMMARY   DISCHARGE DIAGNOSES:  1. Thirty-seven-plus-week gestation, delivered.  2. Severe preeclampsia.  3. Status post normal spontaneous vaginal delivery.   DISCHARGE MEDICATIONS:  1. Motrin 600 mg p.o. q.6h.  2. Darvocet one to two tablets p.o. q.4h. p.r.n.   DISCHARGE FOLLOW-UP:  The patient is to follow-up in the office in 6 weeks  for her postpartum exam.   HOSPITAL COURSE:  The patient is a 35 year old G2 P0-0-1-0 who is admitted  at [redacted] weeks gestation for induction of labor given the finding of severe  preeclampsia.  The patient had been followed closely for several weeks with  significantly increasing edema and blood pressures which were becoming  mildly elevated at [redacted] weeks gestation.  On her office visit on March 15, 2004  the patient was noted to have 3+ proteinuria with a blood pressure of  130/90.  All other labs were within normal limits.  A 24-hour urine  confirmed 3+ grams of protein and the patient was brought in for induction  as she was now considered term and had a favorable cervix, with severe  proteinuria.  Prenatal care was otherwise uneventful except for a UTI which  was treated and had a negative test of cure at approximately 29 weeks.  Prenatal labs are as follows:  A positive, antibody negative, RPR  nonreactive, rubella immune, hepatitis B surface antigen negative, HIV  declined, GC negative, chlamydia negative, group B strep negative, 1-hour  Glucola 85.  Her past obstetrical history was significant for a missed AB  with a D&C in 2004.  Past GYN history:  None.   Past surgical history:  The  D&C in 2004, appendectomy in 1996, cholecystectomy in 2002, and  tonsillectomy in 1980.  Past medical history:  She had exercise-induced  asthma, history of anxiety, and interstitial cystitis.  Allergies included  CODEINE, HYDROCODONE, PERCOCET, PYRIDIUM, DITROPAN, ZOMIG, and ULTRAM.  She  was on no medications.  On admission, blood pressure was 140/90.  Her NST  was reactive.  The patient's cervix was 50 and 2 and a -2 station.  She had  rupture of membranes with clear fluid obtained and 3+ edema up to her thigh  level.  Reflexes were 1-2+.  She did have PIH labs repeated which were  normal and was placed on magnesium for seizure prophylaxis, given her severe  preeclampsia.  The patient was placed on Pitocin and had assisted rupture of  membranes with clear fluid obtained.  She progressed quickly to complete  dilation and pushed well with a normal spontaneous vaginal delivery of a  vigorous female infant over a second degree laceration.  Apgars were 8 and  9, weight was 6 pounds 4 ounces.  Nuchal cord x1 was reduced over the head.  Placenta was adherent to the uterine wall and did require manual extraction  after approximately 20 minutes.  There was some mild uterine atony which  responded to bimanual massage and the patient did require some manual  exploration to remove placental fragments.  There were no large fragments  palpable at the conclusion of placental extraction.  The patient did well.  Her estimated blood loss was 500 mL.  Her second degree laceration was  repaired with 2-0 Vicryl and the sphincter was reinforced.  Cervix and  rectum were intact.  The patient was then admitted to  the AICU postpartum and continued on her magnesium until she began diuresing  well 24 hours later.  On postpartum day #2 blood pressure was stable,  hemoglobin was 10.1, fundus was firm, she felt much improved, and was felt  stable for discharge home, and was discharged  with medications as previously  stated.                                               Huel Cote, M.D.    KR/MEDQ  D:  04/11/2004  T:  04/12/2004  Job:  [id number]

## 2011-01-10 NOTE — Op Note (Signed)
[facility]  Patient:    [name], Katherine Visit Number: [id number] MRN: [id number]          Service Type: SUR Location: 3W [id number] Attending Physician:  Vikki Ports Dictated by:   Vikki Ports, M.D. Proc. Date: 08/03/01 Admit Date:  08/03/2001 Discharge Date: 08/04/2001   CC:         Molly Maduro D. Arlyce Dice, M.D. [facility]   Operative Report  PREOPERATIVE DIAGNOSIS:  Biliary dyskinesia.  POSTOPERATIVE DIAGNOSIS:  Biliary dyskinesia.  OPERATION:  Laparoscopic cholecystectomy.  SURGEON:  Vikki Ports, M.D.  ASSISTANT:  Gita Kudo, M.D.  ANESTHESIA:  General.  DESCRIPTION OF PROCEDURE:  The patient was taken to the operating room and placed in the supine position.  After adequate anesthesia was induced using endotracheal tube, the abdomen was prepped and draped in the normal sterile fashion.  Using a transverse infraumbilical incision, I dissected down to the fascia.  The fascia was opened vertically and an 0 Vicryl pursestring suture was placed around the fascial defect.  The abdomen was then insufflated to 15 mmHg using continuous slow carbon dioxide.  Under direct visualization a 10 mm probe was placed in the subxiphoid region.  Two 5 mm ports were placed in the right abdomen.  The gallbladder was identified and retracted cephalad. Dissection began down on the neck of the gallbladder until a very small tortuous cystic duct was identified, dissected free of surrounding structures, triply clipped and divided.  Cystic artery which laid just lateral and anterior the cystic duct was then triply clipped and divided.  The gallbladder was then taken off the gallbladder bed using Bovie electrocautery.  A small rent was made in the posterior wall of the gallbladder.  Because the patient had no stones, gallbladder was completely removed off the gallbladder bed into the umbilical port.  It was not placed in an endocatch bag.  All  bile was then suctioned from the right upper quadrant.  The gallbladder bed was copiously irrigated.  All fluid returned clear.  All trocars were removed.  Fascial defect was closed with an 0 Vicryl pursestring suture.  Abdomen was allowed to deflate.  Skin incisions were closed with subcuticular 4-0 Monocryl. Steri-Strips and sterile dressings were applied.  The patient tolerated the procedure well and went to PACU in good condition. Dictated by:   Vikki Ports, M.D. Attending Physician:  Danna Hefty R. DD:  08/05/01 TD:  08/05/01 Job: [id number]

## 2011-02-07 ENCOUNTER — Other Ambulatory Visit (HOSPITAL_COMMUNITY): Payer: Self-pay | Admitting: Interventional Radiology

## 2011-02-07 DIAGNOSIS — I729 Aneurysm of unspecified site: Secondary | ICD-10-CM

## 2011-02-10 ENCOUNTER — Emergency Department (HOSPITAL_COMMUNITY): Payer: BC Managed Care – PPO

## 2011-02-10 ENCOUNTER — Emergency Department (HOSPITAL_COMMUNITY)
Admission: EM | Admit: 2011-02-10 | Discharge: 2011-02-10 | Disposition: A | Discharge status: Home / Self Care | Payer: BC Managed Care – PPO | Attending: Neurology | Admitting: Neurology

## 2011-02-10 DIAGNOSIS — J45909 Unspecified asthma, uncomplicated: Secondary | ICD-10-CM | POA: Insufficient documentation

## 2011-02-10 DIAGNOSIS — R110 Nausea: Secondary | ICD-10-CM | POA: Insufficient documentation

## 2011-02-10 DIAGNOSIS — Z7982 Long term (current) use of aspirin: Secondary | ICD-10-CM | POA: Insufficient documentation

## 2011-02-10 DIAGNOSIS — R51 Headache: Secondary | ICD-10-CM | POA: Insufficient documentation

## 2011-02-10 DIAGNOSIS — R42 Dizziness and giddiness: Secondary | ICD-10-CM | POA: Insufficient documentation

## 2011-02-10 DIAGNOSIS — R5381 Other malaise: Secondary | ICD-10-CM | POA: Insufficient documentation

## 2011-02-10 DIAGNOSIS — Z79899 Other long term (current) drug therapy: Secondary | ICD-10-CM | POA: Insufficient documentation

## 2011-02-10 DIAGNOSIS — R5383 Other fatigue: Secondary | ICD-10-CM | POA: Insufficient documentation

## 2011-02-10 LAB — CBC
HCT: 36.1 % (ref 36.0–46.0)
MCH: 30 pg (ref 26.0–34.0)
MCHC: 34.3 g/dL (ref 30.0–36.0)
MCV: 87.4 fL (ref 78.0–100.0)
Platelets: 221 10*3/uL (ref 150–400)
RDW: 13.1 % (ref 11.5–15.5)
WBC: 6.2 10*3/uL (ref 4.0–10.5)

## 2011-02-10 LAB — DIFFERENTIAL
Eosinophils Absolute: 0.2 10*3/uL (ref 0.0–0.7)
Eosinophils Relative: 4 % (ref 0–5)
Lymphocytes Relative: 33 % (ref 12–46)
Lymphs Abs: 2.1 10*3/uL (ref 0.7–4.0)
Monocytes Absolute: 0.4 10*3/uL (ref 0.1–1.0)
Monocytes Relative: 6 % (ref 3–12)

## 2011-02-10 LAB — COMPREHENSIVE METABOLIC PANEL
ALT: 17 U/L (ref 0–35)
AST: 17 U/L (ref 0–37)
Albumin: 3.7 g/dL (ref 3.5–5.2)
CO2: 22 mEq/L (ref 19–32)
Chloride: 109 mEq/L (ref 96–112)
Creatinine, Ser: 0.75 mg/dL (ref 0.50–1.10)
Sodium: 137 mEq/L (ref 135–145)
Total Bilirubin: 0.4 mg/dL (ref 0.3–1.2)

## 2011-02-10 LAB — TROPONIN I: Troponin I: <0.3 ng/mL (ref <0.30)

## 2011-02-10 LAB — CK TOTAL AND CKMB (NOT AT ARMC): Relative Index: 1.7 (ref 0.0–2.5)

## 2011-02-10 LAB — APTT: aPTT: 32 seconds (ref 24–37)

## 2011-02-11 ENCOUNTER — Ambulatory Visit (HOSPITAL_COMMUNITY)
Admission: RE | Admit: 2011-02-11 | Discharge: 2011-02-11 | Disposition: A | Discharge status: Home / Self Care | Payer: BC Managed Care – PPO | Source: Ambulatory Visit | Attending: Interventional Radiology | Admitting: Interventional Radiology

## 2011-02-11 ENCOUNTER — Other Ambulatory Visit (HOSPITAL_COMMUNITY): Payer: Self-pay | Admitting: Interventional Radiology

## 2011-02-11 DIAGNOSIS — I671 Cerebral aneurysm, nonruptured: Secondary | ICD-10-CM | POA: Insufficient documentation

## 2011-02-11 DIAGNOSIS — I729 Aneurysm of unspecified site: Secondary | ICD-10-CM

## 2011-02-11 DIAGNOSIS — Z9889 Other specified postprocedural states: Secondary | ICD-10-CM | POA: Insufficient documentation

## 2011-02-11 DIAGNOSIS — Z01812 Encounter for preprocedural laboratory examination: Secondary | ICD-10-CM | POA: Insufficient documentation

## 2011-02-11 DIAGNOSIS — R51 Headache: Secondary | ICD-10-CM | POA: Insufficient documentation

## 2011-02-11 DIAGNOSIS — R42 Dizziness and giddiness: Secondary | ICD-10-CM | POA: Insufficient documentation

## 2011-02-11 LAB — CBC
Hemoglobin: 13.7 g/dL (ref 12.0–15.0)
MCH: 30.7 pg (ref 26.0–34.0)
MCHC: 34.8 g/dL (ref 30.0–36.0)
MCV: 88.3 fL (ref 78.0–100.0)
Platelets: 270 10*3/uL (ref 150–400)
RBC: 4.46 MIL/uL (ref 3.87–5.11)

## 2011-02-11 LAB — POCT I-STAT, CHEM 8
BUN: 8 mg/dL (ref 6–23)
Calcium, Ion: 1.27 mmol/L (ref 1.12–1.32)
Chloride: 113 mEq/L — ABNORMAL HIGH (ref 96–112)
Sodium: 139 mEq/L (ref 135–145)

## 2011-02-11 LAB — BASIC METABOLIC PANEL
CO2: 21 mEq/L (ref 19–32)
Chloride: 108 mEq/L (ref 96–112)
Glucose, Bld: 173 mg/dL — ABNORMAL HIGH (ref 70–99)
Potassium: 4.3 mEq/L (ref 3.5–5.1)
Sodium: 137 mEq/L (ref 135–145)

## 2011-02-11 LAB — PROTIME-INR: INR: 1.07 (ref 0.00–1.49)

## 2011-02-11 MED ORDER — IOHEXOL 300 MG/ML  SOLN
200.0000 mg | Freq: Once | INTRAMUSCULAR | Status: AC | PRN
  Administered 2011-02-11: 85 mL via INTRAVENOUS

## 2011-02-23 ENCOUNTER — Encounter: Payer: BC Managed Care – PPO | Admitting: Oncology

## 2011-03-09 ENCOUNTER — Emergency Department (HOSPITAL_COMMUNITY)
Admission: EM | Admit: 2011-03-09 | Discharge: 2011-03-10 | Disposition: A | Discharge status: Other Acute Inpatient Hospital | Payer: BC Managed Care – PPO | Attending: Emergency Medicine | Admitting: Emergency Medicine

## 2011-03-09 DIAGNOSIS — T50901A Poisoning by unspecified drugs, medicaments and biological substances, accidental (unintentional), initial encounter: Secondary | ICD-10-CM | POA: Insufficient documentation

## 2011-03-09 DIAGNOSIS — F3289 Other specified depressive episodes: Secondary | ICD-10-CM | POA: Insufficient documentation

## 2011-03-09 DIAGNOSIS — F329 Major depressive disorder, single episode, unspecified: Secondary | ICD-10-CM | POA: Insufficient documentation

## 2011-03-09 DIAGNOSIS — Z79899 Other long term (current) drug therapy: Secondary | ICD-10-CM | POA: Insufficient documentation

## 2011-03-09 DIAGNOSIS — T50902A Poisoning by unspecified drugs, medicaments and biological substances, intentional self-harm, initial encounter: Secondary | ICD-10-CM | POA: Insufficient documentation

## 2011-03-09 DIAGNOSIS — E876 Hypokalemia: Secondary | ICD-10-CM | POA: Insufficient documentation

## 2011-03-09 LAB — COMPREHENSIVE METABOLIC PANEL
ALT: 20 U/L (ref 0–35)
Albumin: 3.9 g/dL (ref 3.5–5.2)
Alkaline Phosphatase: 60 U/L (ref 39–117)
BUN: 11 mg/dL (ref 6–23)
Calcium: 9.2 mg/dL (ref 8.4–10.5)
Potassium: 3.3 mEq/L — ABNORMAL LOW (ref 3.5–5.1)
Sodium: 138 mEq/L (ref 135–145)
Total Protein: 6.5 g/dL (ref 6.0–8.3)

## 2011-03-09 LAB — RAPID URINE DRUG SCREEN, HOSP PERFORMED
Barbiturates: NOT DETECTED
Benzodiazepines: NOT DETECTED
Cocaine: NOT DETECTED
Tetrahydrocannabinol: NOT DETECTED

## 2011-03-09 LAB — CBC
MCHC: 34.2 g/dL (ref 30.0–36.0)
RDW: 13 % (ref 11.5–15.5)

## 2011-03-09 LAB — ETHANOL: Alcohol, Ethyl (B): <11 mg/dL (ref 0–11)

## 2011-03-10 ENCOUNTER — Inpatient Hospital Stay (HOSPITAL_COMMUNITY)
Admission: EM | Admit: 2011-03-10 | Discharge: 2011-03-14 | DRG: 430 | Disposition: A | Discharge status: Home / Self Care | Payer: BC Managed Care – PPO | Source: Other Acute Inpatient Hospital | Attending: Psychiatry | Admitting: Psychiatry

## 2011-03-10 DIAGNOSIS — Z8673 Personal history of transient ischemic attack (TIA), and cerebral infarction without residual deficits: Secondary | ICD-10-CM

## 2011-03-10 DIAGNOSIS — G43909 Migraine, unspecified, not intractable, without status migrainosus: Secondary | ICD-10-CM

## 2011-03-10 DIAGNOSIS — F322 Major depressive disorder, single episode, severe without psychotic features: Secondary | ICD-10-CM

## 2011-03-10 DIAGNOSIS — F411 Generalized anxiety disorder: Secondary | ICD-10-CM

## 2011-03-10 DIAGNOSIS — T4272XA Poisoning by unspecified antiepileptic and sedative-hypnotic drugs, intentional self-harm, initial encounter: Secondary | ICD-10-CM

## 2011-03-10 DIAGNOSIS — T426X2A Poisoning by other antiepileptic and sedative-hypnotic drugs, intentional self-harm, initial encounter: Secondary | ICD-10-CM

## 2011-03-10 DIAGNOSIS — T426X1A Poisoning by other antiepileptic and sedative-hypnotic drugs, accidental (unintentional), initial encounter: Secondary | ICD-10-CM

## 2011-03-10 DIAGNOSIS — T4271XA Poisoning by unspecified antiepileptic and sedative-hypnotic drugs, accidental (unintentional), initial encounter: Secondary | ICD-10-CM

## 2011-03-10 DIAGNOSIS — Z7982 Long term (current) use of aspirin: Secondary | ICD-10-CM

## 2011-03-10 DIAGNOSIS — F332 Major depressive disorder, recurrent severe without psychotic features: Principal | ICD-10-CM

## 2011-03-10 DIAGNOSIS — J45909 Unspecified asthma, uncomplicated: Secondary | ICD-10-CM

## 2011-03-11 NOTE — Assessment & Plan Note (Signed)
Katherine [name], Katherine [name]              ACCOUNT NO.:  [id number]  MEDICAL RECORD NO.:  [id number]  LOCATION:  0502                          [facility]ILITY[facility]  PHYSICIAN:  Franchot Gallo, MD     DATE OF BIRTH:  08/12/1976  DATE OF ADMISSION:  03/10/2011 DATE OF DISCHARGE:                      PSYCHIATRIC ADMISSION ASSESSMENT   IDENTIFICATION:  A 35 year old female voluntarily admitted on March 10, 2011.  HISTORY OF PRESENT ILLNESS:  The patient is here after an overdose on Sonata, taking approximately 28 tablets.  The patient has been under significant stress.  Her husband completed suicide approximately 1 year ago, a single mother of 2 children with behavioral issues and having problems with some family support, who feels like she should "get over her husband's death."  She also had not been sleeping well, has lost a small amount of weight with an increase of her Topamax.  She is stating that she is just tired of making decisions.  PAST PSYCHIATRIC HISTORY:  The patient was here approximately 1 year ago for what she states was an unintentional suicide attempt, drinking alcohol, and taking a sleeping pill.  She sees Lamarr Lulas for depressive symptoms.  SOCIAL HISTORY:  The patient is widowed.  She has 2 small children.  Her husband completed suicide last year.  The patient does have a nanny that works full time for her.  FAMILY HISTORY:  None.  ALCOHOL/DRUG HISTORY:  She denies any alcohol substance use.  PRIMARY CARE PROVIDER:  Dr. Antonietta Barcelona at [facility].  MEDICAL PROBLEMS:  A history of asthma and has had 2 cerebral aneurysms.  MEDICATIONS:  The patient lists aspirin 81 mg daily, Cymbalta 120 mg daily, Topamax 50 mg daily and 100 mg nightly for migraine headaches, Klonopin 0.5 mg daily, 1 mg at 4:00 p.m. and 1 at bedtime, Wellbutrin 150 mg daily.  The patient also takes Zyrtec and Imitrex for allergies and headaches.  DRUG ALLERGIES:  Drug allergies list is CODEINE,  HYDROCODONE, OXYCODONE, TRAMADOL, PYRIDIUM, DITROPAN, ZOLMIG, POTASSIUM CHLORIDE.  PHYSICAL EXAMINATION:  Done at [facility]. She is a well nourished, normally developed female.  The patient received charcoal.  Poison Control was notified.  Her potassium is at 3.3, and she received K-Dur 40 mEq.  Acetaminophen level less than 15.  CBC was within normal limits.  Alcohol level is less than 11.  MENTAL STATUS EXAM:  The patient was resting in bed, seemed tired and sad.  Her thought processes were coherent.  No evidence of any delusional thinking.  Did not appear anxious.  Cognitive function intact.  Her memory appears intact.  Judgment insight are fair.  DIAGNOSES:  AXIS I: 1. Major depressive disorder, recurrent, severe. 2. Generalized anxiety disorder, severe.  AXIS II:  Deferred.  AXIS III:  History of asthma, bronchitis, cerebral aneurysms, and headaches.  AXIS IV:  Psychosocial problems, widowed, coping with children.  AXIS V:  Current is 35.  Our plan is to continue the patient's medications.  We ordered trazodone for sleep, as patient has been having difficulty with sleeping.  She will contact her support group.  The patient to continue with her ongoing therapy.  Her tentative length of stay at this time is 3-4  days.     Landry Corporal, N.P.   ______________________________ Franchot Gallo, MD    JO/MEDQ  D:  03/10/2011  T:  03/10/2011  Job:  [id number]  Electronically Signed by Limmie PatriciaP. on 03/11/2011 09:02:03 AM Electronically Signed by Franchot Gallo MD on 03/11/2011 12:45:19 PM

## 2011-03-16 NOTE — Discharge Summary (Signed)
NAMEMISHEL, Katherine [name]              ACCOUNT NO.:  [id number]  MEDICAL RECORD NO.:  [id number]  LOCATION:  1610                          [facility]ACILITY[facility]  [facility]  PHYSICIAN:  Franchot Gallo, MD     DATE OF BIRTH:  Oct 21, 1975  DATE OF ADMISSION:  03/10/2011 DATE OF DISCHARGE:  03/14/2011                              DISCHARGE SUMMARY   REASON FOR ADMISSION:  This is a 35 year old female that presented after an overdose on Sonata,  taking approximately 28 tablets. She was under significant stress. Her husband had completed suicide 1 year ago, a single mother of two children with behavioral issues and having problems with family support.  Her final impression was major depressive disorder, recurrent, severe; generalized anxiety disorder, severe.  PERTINENT LABORATORIES:  The patient had a potassium of 3.3 for which she did receive K-Dur 40 mEq in the emergency room.  Acetaminophen level was less than 15.  CBC was within normal limits.  Her alcohol level was less than 11.  SIGNIFICANT FINDINGS:  The patient was admitted to the adult milieu for safety and stabilization.  We continued with her home medications and had trazodone available for sleep as she was having difficulty with this at home.  We contacted her support group.  She was active in attending groups.  She was reporting that her sleep was improving.  Her appetite was low, endorsing moderate depressive symptoms, rating it at 5 on a scale of 1-10.  Denied any suicidal or homicidal thoughts or any psychotic symptoms.  We increased Klonopin to help with her anxiety, Topamax for headache control, and increased Wellbutrin to lessen her depressive symptoms.  The patient was informed in the interdisciplinary team that CPS would be making an assessment to her home in regards to her children being present during the suicide attempt.  On July 18, she had problems with sleep the night prior. Her appetite was fair.  She was rating her depression  as 6 on a scale of 1- 10.  She denied any suicidal or homicidal thoughts or any psychotic symptoms.  She was reporting that her parents want to put her children in a group home.  On July 19, her sleep had improved.  Her appetite had improved.  She was rating her depression a 5 and a scale of 1-10. Denied any suicidal or homicidal thoughts, having no psychotic symptoms. She was still having some anxiety as she was worried about the CPS report affecting her children remaining at home.  On day of discharge, the patient was seen in the interdisciplinary team to address any safety concerns. The patient was reporting her sleep was good.  Her appetite was good, rating her depression of 4, having no suicidal or homicidal thoughts or auditory or visual hallucinations. Having no medication side effects.  She was informed that she cannot be with her children without supervision.  She had a conversation with her father the night prior which was beneficial.  She was able to provide good safety plans with the children's behavior and was happy that she is going to have in-home services with a 24-hour/7 nanny able to help her.  DISCHARGE DIAGNOSIS:  AXIS I:  Major Depressive  Disorder - Recurrent   Generalized Anxiety Disorder AXIS II: Deferred AXIS III: 1.  H/O Cerebral Aneurysm x 2.    2.  H/O Asthma.   3.  Migraine Headaches. AXIS IV: Issues with Primary Support System.  Recent death of husband. Axis V:  GAF at time of admission 35.  GAF at time of discharge 60.  DISCHARGE MEDICATIONS: 1. Claritin 10 mg daily. 2. Cymbalta 60 mg, taking two daily. 3. Aspirin 81 mg daily. 4. Topamax 50 mg daily and 100 mg at bedtime. 5. Wellbutrin XL 300 mg daily. 6. Klonopin 1 mg at 7:00, 2:00 and 10:00 p.m.  FOLLOW-UP APPOINTMENT:  [facility], seeing [name], phone number [phone], and [facility], seeing [name], phone number [phone] on March 20, 2011, at 5:00  p.m.     Landry Corporal, N.P.   ______________________________ Franchot Gallo, MD    JO/MEDQ  D:  03/14/2011  T:  03/15/2011  Job:  [id number]  Electronically Signed by Limmie PatriciaP. on 03/15/2011 03:16:47 PM Electronically Signed by Franchot Gallo MD on 03/16/2011 10:14:14 AM

## 2011-03-21 ENCOUNTER — Encounter (HOSPITAL_BASED_OUTPATIENT_CLINIC_OR_DEPARTMENT_OTHER): Payer: BC Managed Care – PPO | Admitting: Oncology

## 2011-03-21 ENCOUNTER — Other Ambulatory Visit: Payer: Self-pay | Admitting: Oncology

## 2011-03-21 DIAGNOSIS — D689 Coagulation defect, unspecified: Secondary | ICD-10-CM

## 2011-03-21 LAB — MORPHOLOGY

## 2011-03-21 LAB — CBC & DIFF AND RETIC
BASO%: 0.3 % (ref 0.0–2.0)
Eosinophils Absolute: 0.2 10*3/uL (ref 0.0–0.5)
Immature Retic Fract: 3.9 % (ref 0.00–10.70)
LYMPH%: 28.7 % (ref 14.0–49.7)
MONO#: 0.4 10*3/uL (ref 0.1–0.9)
NEUT#: 4.1 10*3/uL (ref 1.5–6.5)
Platelets: 238 10*3/uL (ref 145–400)
RBC: 3.86 10*6/uL (ref 3.70–5.45)
Retic %: 1.09 % (ref 0.50–1.50)
Retic Ct Abs: 42.07 10*3/uL (ref 18.30–72.70)
WBC: 6.6 10*3/uL (ref 3.9–10.3)
lymph#: 1.9 10*3/uL (ref 0.9–3.3)

## 2011-03-21 LAB — CHCC SMEAR

## 2011-03-21 LAB — IVY BLEEDING TIME: Bleeding Time: 15 Minutes (ref 2.0–8.0)

## 2011-03-26 ENCOUNTER — Encounter (HOSPITAL_BASED_OUTPATIENT_CLINIC_OR_DEPARTMENT_OTHER): Payer: BC Managed Care – PPO | Admitting: Oncology

## 2011-03-26 DIAGNOSIS — I998 Other disorder of circulatory system: Secondary | ICD-10-CM

## 2011-03-28 LAB — ANA: Anti Nuclear Antibody(ANA): NEGATIVE

## 2011-03-28 LAB — LUPUS ANTICOAGULANT PANEL: PTT Lupus Anticoagulant: 36.4 secs (ref 30.0–45.6)

## 2011-03-28 LAB — CARDIOLIPIN ANTIBODIES, IGG, IGM, IGA
Anticardiolipin IgA: 7 APL U/mL (ref <22)
Anticardiolipin IgG: 11 GPL U/mL (ref <23)
Anticardiolipin IgM: 2 MPL U/mL (ref <11)

## 2011-03-28 LAB — BETA-2 GLYCOPROTEIN ANTIBODIES
Beta-2 Glyco I IgG: 0 G Units (ref <20)
Beta-2-Glycoprotein I IgA: 2 A Units (ref <20)

## 2011-03-28 LAB — HOMOCYSTEINE: Homocysteine: 8.4 umol/L (ref 4.0–15.4)

## 2011-05-30 LAB — DIFFERENTIAL
Basophils Absolute: 0.1
Basophils Relative: 1
Eosinophils Absolute: 0.2
Neutro Abs: 7.2
Neutrophils Relative %: 65

## 2011-05-30 LAB — PROTEIN, URINE, 24 HOUR: Protein, 24H Urine: 698 — ABNORMAL HIGH

## 2011-05-30 LAB — CBC
HCT: 28.4 — ABNORMAL LOW
HCT: 29.7 — ABNORMAL LOW
HCT: 34.4 — ABNORMAL LOW
HCT: 34.8 — ABNORMAL LOW
Hemoglobin: 10.4 — ABNORMAL LOW
Hemoglobin: 12
Hemoglobin: 12.4
MCHC: 34.9
MCV: 92.8
Platelets: 199
Platelets: 223
RBC: 3.2 — ABNORMAL LOW
RBC: 3.71 — ABNORMAL LOW
RDW: 13.9
RDW: 14.1
WBC: 11.5 — ABNORMAL HIGH
WBC: 9.5
WBC: 9.6

## 2011-05-30 LAB — LACTATE DEHYDROGENASE
LDH: 137
LDH: 166
LDH: 174

## 2011-05-30 LAB — COMPREHENSIVE METABOLIC PANEL
ALT: 11
Albumin: 2.5 — ABNORMAL LOW
Alkaline Phosphatase: 120 — ABNORMAL HIGH
Alkaline Phosphatase: 92
BUN: 6
BUN: 7
BUN: 7
CO2: 22
Calcium: 8.7
Chloride: 107
Chloride: 108
GFR calc non Af Amer: >60
Glucose, Bld: 100 — ABNORMAL HIGH
Glucose, Bld: 86
Glucose, Bld: 88
Potassium: 3.6
Potassium: 3.9
Sodium: 136
Total Bilirubin: 0.4
Total Bilirubin: 0.7
Total Protein: 4.9 — ABNORMAL LOW
Total Protein: 5.4 — ABNORMAL LOW

## 2011-05-30 LAB — CREATININE CLEARANCE, URINE, 24 HOUR
Collection Interval-CRCL: 24
Creatinine, Urine: 94.7

## 2011-05-30 LAB — URIC ACID
Uric Acid, Serum: 4.5
Uric Acid, Serum: 4.8

## 2011-05-30 LAB — RH IMMUNE GLOB WKUP(>/=20WKS)(NOT WOMEN'S HOSP)

## 2011-05-30 LAB — RPR: RPR Ser Ql: NONREACTIVE

## 2011-06-02 LAB — STREP B DNA PROBE: Strep Group B Ag: NEGATIVE

## 2011-06-02 LAB — COMPREHENSIVE METABOLIC PANEL
ALT: 13
AST: 20
AST: 22
Albumin: 2.5 — ABNORMAL LOW
Albumin: 2.7 — ABNORMAL LOW
Alkaline Phosphatase: 102
Alkaline Phosphatase: 92
BUN: 7
Chloride: 104
Chloride: 105
Creatinine, Ser: 0.51
GFR calc Af Amer: >60
Potassium: 3.8
Potassium: 4.1
Sodium: 138
Total Bilirubin: 0.9
Total Bilirubin: 0.9
Total Protein: 5.4 — ABNORMAL LOW
Total Protein: 6.2

## 2011-06-02 LAB — CBC
HCT: 34.1 — ABNORMAL LOW
Platelets: 222
Platelets: 252
RDW: 14.2
RDW: 14.2
WBC: 8.3
WBC: 9.3

## 2011-06-02 LAB — URIC ACID: Uric Acid, Serum: 4.2

## 2011-06-02 LAB — CREATININE CLEARANCE, URINE, 24 HOUR
Creatinine, 24H Ur: 1410
Creatinine, Urine: 134.3
Creatinine: 0.56
Urine Total Volume-CRCL: 1050

## 2011-06-03 LAB — URINALYSIS, ROUTINE W REFLEX MICROSCOPIC
Glucose, UA: NEGATIVE
Leukocytes, UA: NEGATIVE
pH: 7

## 2011-06-03 LAB — URINE MICROSCOPIC-ADD ON

## 2011-06-03 LAB — FETAL FIBRONECTIN: Fetal Fibronectin: NEGATIVE

## 2011-06-04 LAB — RH IMMUNE GLOBULIN WORKUP (NOT WOMEN'S HOSP)

## 2011-06-04 LAB — TORCH-IGM(TOXO/ RUB/ CMV/ HSV) W TITER
CMV IgM: 0.71 IV
Rubella IgM Index: 0.46 IV
Toxoplasma IgM: 0.4 IV

## 2011-06-04 LAB — GLUCOSE TOLERANCE, 1 HOUR: Glucose, 1 Hour GTT: 131

## 2011-06-04 LAB — TORCH TITERS-IGG(TOXO/ RUB/ CMV/ HSV): CMV IgG: 0.24 IV

## 2011-07-02 NOTE — Progress Notes (Signed)
CC:   Katherine [name], M.D. Katherine Bears, MD Katherine [name], M.D. Katherine [name], M.D.  REFERRING PHYSICIAN:  Turkey R. [name], M.D.  New patient evaluation for this 35 year old woman, referred by Dr. Beverley Fiedler for further evaluation of easy bruising.  Katherine [name] has a history of complex migraine headache and has variously presented in the past with a headache, syncopal episodes, and transient facial weakness.  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  She underwent an endovascular stent assisted coiling of the left aneurysm on 01/16/2010 and subsequent the procedure on the right side on 03/11/2010. She has had a number of followup MRIs, CT scans, and most recently carotid arteriography on 02/13/2011 to evaluate new neurologic symptoms. None of these studies have shown any significant additional pathology. Unfortunately she had no relief of her headache or other atypical migraine symptoms following the coil embolization of the aneurysms.  She was initially put on aspirin 325 mg daily, plus Plavix which were continued for about 6 months after the angiography procedures.  Aspirin dose was decreased to 81 mg at approximately 12 months after the first procedure.  She is on a number of other medications, but no other agents with known antiplatelet activity.  Her current drug regimen also includes Cymbalta 120 mg daily, Topamax 50 mg in the a.m., 100 mg in the evening, clonazepam 1 mg t.i.d., Zyrtec 10 mg h.s., trazodone 50 mg p.r.n., Zanaflex 2 mg p.r.n. up to t.i.d., Imitrex p.r.n. acute migraine, and Tylenol p.r.n.  Over the last few months, she has had heavy menstrual periods.  She has noted increased bruising.  Most of the bruises are small.  They are usually not associated with any trauma.  They have occurred in unusual locations, including her back.  She developed 1 large ecchymosis,  medial aspect of her left knee, approximately 10 x 10 cm, prompting this referral.  A von Willebrand profile was done by her primary care physician in anticipation of her visit here today and was perfectly normal.  If anything, she had higher levels of von Willebrand factor than the highest control using the testing with VW antigen 144% of control (50- 150) and a von Willebrand factor activity of 158% of control (50-170). Factor VIII activity 101% (50-150) done 02/07/2011.  She had a normal pro-time of 11.6 seconds and PTT 28 seconds done the same day.  I was not aware that she was on aspirin prior to this visit and lab done in my office included a bleeding time which was 15 minutes, consistent with an aspirin defect.  She has had a number of surgical procedures in the past, including tonsillectomy at age 20 months, appendectomy in the ninth grade, wisdom tooth extractions at age 74, cholecystectomy at age 67.  She had 2 vaginal deliveries.  She developed abruption of the placenta both times, but did not have any excessive bleeding.  She had 1 D and C procedure in 2004 for a blighted ovum.  The only time she experienced any significant bleeding was at the time of the initial coil embolization, which was done via a right femoral artery approach.  She had significant bleeding from the puncture site and required sandbag pressure for about 9 hours after the procedure.  She states that a small clip or suture was placed the second time she had the procedure in July 2011 and she did not encounter excessive bleeding that time.  There is  really no family history of any bleeding disorder.  Her mother has migraines and cardiac disease, but no excessive bleeding.  Maternal grandmother with migraines.  Father with no bleeding problems.  One 28- year-old brother with no bleeding problems.  Two female children, aged 57 and 7.  The 15-year-old had surgery for a cleft palate.  The other child has had  some surgeries as well and neither one has had problems with bleeding.  SOCIAL HISTORY:  She is a Runner, broadcasting/film/video.  She was married, but her husband committed suicide right around the time she was diagnosed with the ophthalmic artery aneurysms.  She does not smoke.  She uses an occasional alcoholic beverage.  ADDITIONAL REVIEW OF SYSTEMS:  Unremarkable.  She has had some problems with ovarian cysts.  PHYSICAL EXAMINATION:  General:  Healthy-appearing woman.  Vital Signs: Blood pressure 103/67, pulse 85, regular, respirations 20, temperature 97.6, weight 154 pounds, height 5 feet 3-1/2 inches.  Skin:  At present some small resolving ecchymoses on her right arm.  No other areas of active ecchymoses at present.  HEENT:  Pupils equal, reactive to light. Optic disk sharp on the left, not well visualized on the right. Pharynx:  No erythema or exudate.  Neck:  Supple.  No thyromegaly. Lungs:  Clear, resonant to percussion.  Cardiac:  Regular cardiac rhythm.  No murmur.  Abdomen:  Soft and nontender.  No mass, no organomegaly.  Extremities:  No edema.  No calf tenderness.  Neurologic: Mental status intact.  Cranial nerves intact.  Motor strength 5/5. Reflexes 2+, symmetric.  Coordination is normal.  LABORATORIES:  Hemoglobin 11.5, hematocrit 33.9, MCV 87.8, white count 6600, with 63 neutrophils, 29 lymphocytes, 5 monocytes, 3 eosinophils, platelets 238,000.  Retic count 1.1%.  Review of the peripheral blood shows normal red cells, mature white cells, normal platelet number and morphology.  IMPRESSION:  Easy bruisability, most likely due to aspirin sensitivity.  She may have a mild qualitative platelet defect that is exaggerated by the aspirin use.  She does not have von Willebrand disease.  She is not on medication known to have antiplatelet effects except for the aspirin, although I would really have to go down the list of individual drugs, including Cymbalta, Topamax, and trazodone and do a  literature search to see if any of these drugs has some antiplatelet effect.  There is no evidence for a leukemic process.  She was advised to stay on the aspirin for obvious reasons.  I do not think it would be productive to try to stop the aspirin for more than 2 weeks so that we could do platelet function testing.  She has had a number of surgical procedures in the past and has not had any significant bleeding.  I do not plan any further evaluation at this time.  She would have to have her aspirin stopped for 2 weeks before any major surgical procedure in the future.  If increased bleeding was noted at time of subsequent surgeries, DDAVP 0.3 mcg/kg in 100 mL normal saline IV over 20 minutes could be used 1 hour preop and then q.24 hours postop for 3 to 5 days, depending on the surgery.  DDAVP can override mild qualitative platelet defects, including the aspirin defect in most patients.    ______________________________ Levert Feinstein, M.D., F.A.C.P. JMG/MEDQ  D:  03/26/2011  T:  03/28/2011  Job:  [id number]

## 2011-07-03 NOTE — Miscellaneous (Signed)
ADDENDUM:  Katherine [name] was evaluated by me on 03/26/2011.  There was an error in recording the date of the visit.  She was not seen on 03/27/2011.    ______________________________ Levert Feinstein, M.D., F.A.C.P. JMG/MEDQ  D:  07/02/2011  T:  07/02/2011  Job:  [id number]

## 2011-09-29 ENCOUNTER — Encounter (HOSPITAL_BASED_OUTPATIENT_CLINIC_OR_DEPARTMENT_OTHER): Payer: Self-pay | Admitting: *Deleted

## 2011-09-29 ENCOUNTER — Emergency Department (HOSPITAL_BASED_OUTPATIENT_CLINIC_OR_DEPARTMENT_OTHER)
Admission: EM | Admit: 2011-09-29 | Discharge: 2011-09-30 | Disposition: A | Discharge status: Home / Self Care | Payer: BC Managed Care – PPO | Attending: Emergency Medicine | Admitting: Emergency Medicine

## 2011-09-29 DIAGNOSIS — R110 Nausea: Secondary | ICD-10-CM | POA: Insufficient documentation

## 2011-09-29 DIAGNOSIS — G43809 Other migraine, not intractable, without status migrainosus: Secondary | ICD-10-CM | POA: Insufficient documentation

## 2011-09-29 HISTORY — DX: Cerebral aneurysm, nonruptured: I67.1

## 2011-09-29 LAB — URINALYSIS, ROUTINE W REFLEX MICROSCOPIC
Glucose, UA: NEGATIVE mg/dL
Specific Gravity, Urine: 1.01 (ref 1.005–1.030)
Urobilinogen, UA: 1 mg/dL (ref 0.0–1.0)

## 2011-09-29 LAB — CBC
HCT: 33.5 % — ABNORMAL LOW (ref 36.0–46.0)
RDW: 12.8 % (ref 11.5–15.5)
WBC: 10.1 10*3/uL (ref 4.0–10.5)

## 2011-09-29 LAB — DIFFERENTIAL
Basophils Absolute: 0.1 10*3/uL (ref 0.0–0.1)
Lymphocytes Relative: 31 % (ref 12–46)
Monocytes Absolute: 0.9 10*3/uL (ref 0.1–1.0)
Neutro Abs: 5.5 10*3/uL (ref 1.7–7.7)

## 2011-09-29 LAB — COMPREHENSIVE METABOLIC PANEL
ALT: 25 U/L (ref 0–35)
AST: 22 U/L (ref 0–37)
Alkaline Phosphatase: 46 U/L (ref 39–117)
CO2: 20 mEq/L (ref 19–32)
Chloride: 109 mEq/L (ref 96–112)
Creatinine, Ser: 0.8 mg/dL (ref 0.50–1.10)
GFR calc non Af Amer: >90 mL/min (ref >90)
Potassium: 3.4 mEq/L — ABNORMAL LOW (ref 3.5–5.1)
Sodium: 139 mEq/L (ref 135–145)
Total Bilirubin: 0.5 mg/dL (ref 0.3–1.2)

## 2011-09-29 LAB — PREGNANCY, URINE: Preg Test, Ur: NEGATIVE

## 2011-09-29 NOTE — ED Notes (Signed)
Headache. States this pain is worse than her normal migraine headaches.

## 2011-09-30 ENCOUNTER — Emergency Department (INDEPENDENT_AMBULATORY_CARE_PROVIDER_SITE_OTHER): Payer: BC Managed Care – PPO

## 2011-09-30 DIAGNOSIS — R51 Headache: Secondary | ICD-10-CM

## 2011-09-30 DIAGNOSIS — H538 Other visual disturbances: Secondary | ICD-10-CM

## 2011-09-30 NOTE — ED Provider Notes (Addendum)
History  This chart was scribed for Hanley Seamen, MD by Bennett Scrape. This patient was seen in room MH04/MH04 and the patient's care was started at 12:00AM.  CSN: [id number]  Arrival date & time 09/29/11  2205   First MD Initiated Contact with Patient 09/30/11 0001      Chief Complaint  Patient presents with  . Headache    Patient is a 36 y.o. female presenting with headaches. The history is provided by the patient. No language interpreter was used.  Headache  This is a new problem. The current episode started 12 to 24 hours ago. The problem occurs constantly. The problem has been gradually worsening. The pain is located in the occipital region. The pain does not radiate. Associated symptoms include nausea (Has since resolved). Pertinent negatives include no fever, no syncope, no shortness of breath and no vomiting.    Katherine [name] is a 36 y.o. female with a h/o aneurysms who presents to the Emergency Department complaining of 14 hours of gradual onset, gradually worsening HA located in the left occiput region. She describes the pain as a stabbing feeling that sometimes feels hot and other times feels like ice grating against her skull. She rates her pain an 8 out of 10 at its worse. Her symptoms are aggravated by light and sound. She reports that she took Imitrex with partial improvement in her symptoms. She also c/o nausea that she states has since resolved. She states that she believes her symptoms are to due her being placed on lithium 1.5 weeks ago. She reports that she has been shaky since starting the new medication and has been having trouble walking and writing since yesterday. She has a h/o migraines but states that this pain feels different and is located in a different region than with the migraines.  She denies smoking and alcohol use.  Pt is currently staying at [facility].  Past Medical History  Diagnosis Date  . Migraine   . Cerebral aneurysm      Past Surgical History  Procedure Date  . Brain surgery     No family history on file.  History  Substance Use Topics  . Smoking status: Never Smoker   . Smokeless tobacco: Not on file  . Alcohol Use: No    Review of Systems  Constitutional: Negative for fever and chills.  HENT: Negative for congestion and sore throat.   Respiratory: Negative for cough and shortness of breath.   Cardiovascular: Negative for chest pain and syncope.  Gastrointestinal: Positive for nausea (Has since resolved). Negative for vomiting.  Musculoskeletal: Negative for back pain.  Skin: Negative for rash.  Neurological: Positive for headaches. Negative for weakness and light-headedness.  All other systems reviewed and are negative.    Allergies  Ivp dye; Percocet; Ultram; Zomig; Codeine; and Hydrocodone  Home Medications   Current Outpatient Rx  Name Route Sig Dispense Refill  . ASPIRIN EC 81 MG PO TBEC Oral Take 81 mg by mouth daily.    Marland Kitchen CLORAZEPATE DIPOTASSIUM 7.5 MG PO TABS Oral Take 7.5 mg by mouth 3 (three) times daily.    . DULOXETINE HCL 60 MG PO CPEP Oral Take 120 mg by mouth daily.    Marland Kitchen HYDROXYZINE PAMOATE 25 MG PO CAPS Oral Take 25 mg by mouth every 4 (four) hours as needed. For itching    . LAMOTRIGINE ER 300 MG PO TB24 Oral Take 1 tablet by mouth 2 (two) times daily.    Marland Kitchen  LITHIUM CARBONATE 600 MG PO CAPS Oral Take 600 mg by mouth 2 (two) times daily with a meal.    . MELATONIN 3 MG PO TABS Oral Take 1 tablet by mouth daily.    Marland Kitchen PRESCRIPTION MEDICATION Oral Take 0.25 mg by mouth 2 (two) times daily. Cogentin    . QUETIAPINE FUMARATE 400 MG PO TABS Oral Take 400 mg by mouth at bedtime.    . SUMATRIPTAN SUCCINATE 100 MG PO TABS Oral Take 100 mg by mouth every 2 (two) hours as needed. For migraine    . TOPIRAMATE 50 MG PO TABS Oral Take 50-100 mg by mouth 2 (two) times daily.      Triage Vitals: BP 115/73  Pulse 78  Temp(Src) 98.6 F (37 C) (Oral)  Resp 20  SpO2 100%  LMP  09/22/2011  Physical Exam  Nursing note and vitals reviewed. Constitutional: She is oriented to person, place, and time. She appears well-developed and well-nourished.  HENT:  Head: Normocephalic and atraumatic.  Eyes: Conjunctivae and EOM are normal. Pupils are equal, round, and reactive to light.  Neck: Normal range of motion. Neck supple.  Cardiovascular: Normal rate and regular rhythm.   Pulmonary/Chest: Effort normal and breath sounds normal. No respiratory distress.  Abdominal: Soft. There is no tenderness.  Musculoskeletal: Normal range of motion. She exhibits no edema.  Neurological: She is alert and oriented to person, place, and time. No cranial nerve deficit.       Generalized slowness of speech and slowness of movement, no pronator drift, normal finger to nose test  Skin: Skin is warm and dry.  Psychiatric: She has a normal mood and affect. Her behavior is normal.    ED Course  Procedures (including critical care time)  DIAGNOSTIC STUDIES: Oxygen Saturation is 100% on room air, normal by my interpretation.    COORDINATION OF CARE: 12:09Pm-Discussed head CT and lithium level check with pt and pt agreed to plan.     MDM   Nursing notes and vitals signs, including pulse oximetry, reviewed.  Summary of this visit's results, reviewed by myself:  Labs:  Results for orders placed during the hospital encounter of 09/29/11  URINALYSIS, ROUTINE W REFLEX MICROSCOPIC      Component Value Range   Color, Urine YELLOW  YELLOW    APPearance CLOUDY (*) CLEAR    Specific Gravity, Urine 1.010  1.005 - 1.030    pH 7.0  5.0 - 8.0    Glucose, UA NEGATIVE  NEGATIVE (mg/dL)   Hgb urine dipstick SMALL (*) NEGATIVE    Bilirubin Urine NEGATIVE  NEGATIVE    Ketones, ur NEGATIVE  NEGATIVE (mg/dL)   Protein, ur NEGATIVE  NEGATIVE (mg/dL)   Urobilinogen, UA 1.0  0.0 - 1.0 (mg/dL)   Nitrite NEGATIVE  NEGATIVE    Leukocytes, UA SMALL (*) NEGATIVE   PREGNANCY, URINE      Component  Value Range   Preg Test, Ur NEGATIVE  NEGATIVE   CBC      Component Value Range   WBC 10.1  4.0 - 10.5 (K/uL)   RBC 3.74 (*) 3.87 - 5.11 (MIL/uL)   Hemoglobin 11.5 (*) 12.0 - 15.0 (g/dL)   HCT 40.9 (*) 81.1 - 46.0 (%)   MCV 89.6  78.0 - 100.0 (fL)   MCH 30.7  26.0 - 34.0 (pg)   MCHC 34.3  30.0 - 36.0 (g/dL)   RDW 91.4  78.2 - 95.6 (%)   Platelets 281  150 - 400 (K/uL)  DIFFERENTIAL      Component Value Range   Neutrophils Relative 54  43 - 77 (%)   Neutro Abs 5.5  1.7 - 7.7 (K/uL)   Lymphocytes Relative 31  12 - 46 (%)   Lymphs Abs 3.1  0.7 - 4.0 (K/uL)   Monocytes Relative 9  3 - 12 (%)   Monocytes Absolute 0.9  0.1 - 1.0 (K/uL)   Eosinophils Relative 6 (*) 0 - 5 (%)   Eosinophils Absolute 0.6  0.0 - 0.7 (K/uL)   Basophils Relative 1  0 - 1 (%)   Basophils Absolute 0.1  0.0 - 0.1 (K/uL)  COMPREHENSIVE METABOLIC PANEL      Component Value Range   Sodium 139  135 - 145 (mEq/L)   Potassium 3.4 (*) 3.5 - 5.1 (mEq/L)   Chloride 109  96 - 112 (mEq/L)   CO2 20  19 - 32 (mEq/L)   Glucose, Bld 98  70 - 99 (mg/dL)   BUN 10  6 - 23 (mg/dL)   Creatinine, Ser 3.66  0.50 - 1.10 (mg/dL)   Calcium 9.1  8.4 - 44.0 (mg/dL)   Total Protein 6.5  6.0 - 8.3 (g/dL)   Albumin 4.0  3.5 - 5.2 (g/dL)   AST 22  0 - 37 (U/L)   ALT 25  0 - 35 (U/L)   Alkaline Phosphatase 46  39 - 117 (U/L)   Total Bilirubin 0.5  0.3 - 1.2 (mg/dL)   GFR calc non Af Amer >90  >90 (mL/min)   GFR calc Af Amer >90  >90 (mL/min)  URINE MICROSCOPIC-ADD ON      Component Value Range   Squamous Epithelial / LPF RARE  RARE    WBC, UA 3-6  <3 (WBC/hpf)   RBC / HPF 0-2  <3 (RBC/hpf)   Bacteria, UA FEW (*) RARE   LITHIUM LEVEL      Component Value Range   Lithium Lvl 0.99  0.80 - 1.40 (mEq/L)    Imaging Studies: Ct Head Wo Contrast  09/30/2011  *RADIOLOGY REPORT*  Clinical Data: Headache, blurred vision  CT HEAD WITHOUT CONTRAST  Technique:  Contiguous axial images were obtained from the base of the skull through the  vertex without contrast.  Comparison: 02/10/2011  Findings: Changes of prior aneurysm coiling x2, resulting in some streak artifact. There is no evidence of acute intracranial hemorrhage, brain edema, mass lesion, acute infarction,   mass effect, or midline shift. Acute infarct may be inapparent on noncontrast CT.  No other intra-axial abnormalities are seen, and the ventricles and sulci are within normal limits in size and symmetry.   No abnormal extra-axial fluid collections or masses are identified.  No significant calvarial abnormality.  IMPRESSION: 1. Negative for bleed or other acute intracranial process. 2.  Stable changes of aneurysm coiling.  Original Report Authenticated By: Osa Craver, M.D.   2:22 AM Patient states her headache has improved without medication in the ED. It is now in a well localized spot in the left parietal region. She has no next of kin is her focal motor deficit. Her speech is coherent. Her speech is still slow but her friend acknowledges that this is typical for her when she has taken medications at bedtime. She has an appointment with her neurologist, Dr. Antonietta Barcelona, this morning at 9 AM; her friend will take her there. She had spoken to him yesterday evening before coming to the ED. He told her he thought this was a  complex migraine but with her history of aneurysm and her recent placement on lithium he wanted her evaluated.    I personally performed the services described in this documentation, which was scribed in my presence.  The recorded information has been reviewed and considered.       Hanley Seamen, MD 09/30/11 [id number]  Hanley Seamen, MD 09/30/11 0225  Hanley Seamen, MD 09/30/11 8295  Hanley Seamen, MD 09/30/11 6213

## 2011-10-07 ENCOUNTER — Telehealth (HOSPITAL_COMMUNITY): Payer: Self-pay

## 2011-10-08 ENCOUNTER — Other Ambulatory Visit (HOSPITAL_COMMUNITY): Payer: Self-pay | Admitting: Interventional Radiology

## 2011-10-08 ENCOUNTER — Other Ambulatory Visit: Payer: Self-pay | Admitting: Radiology

## 2011-10-08 DIAGNOSIS — I729 Aneurysm of unspecified site: Secondary | ICD-10-CM

## 2011-10-09 ENCOUNTER — Telehealth (HOSPITAL_COMMUNITY): Payer: Self-pay

## 2011-10-09 ENCOUNTER — Encounter (HOSPITAL_COMMUNITY): Payer: Self-pay | Admitting: Pharmacy Technician

## 2011-10-10 ENCOUNTER — Ambulatory Visit (HOSPITAL_COMMUNITY)
Admission: RE | Admit: 2011-10-10 | Discharge: 2011-10-10 | Disposition: A | Discharge status: Home / Self Care | Payer: BC Managed Care – PPO | Source: Ambulatory Visit | Attending: Interventional Radiology | Admitting: Interventional Radiology

## 2011-10-10 ENCOUNTER — Encounter (HOSPITAL_COMMUNITY): Payer: Self-pay

## 2011-10-10 ENCOUNTER — Other Ambulatory Visit (HOSPITAL_COMMUNITY): Payer: Self-pay | Admitting: Interventional Radiology

## 2011-10-10 DIAGNOSIS — I729 Aneurysm of unspecified site: Secondary | ICD-10-CM

## 2011-10-10 DIAGNOSIS — Z9889 Other specified postprocedural states: Secondary | ICD-10-CM | POA: Insufficient documentation

## 2011-10-10 DIAGNOSIS — G43909 Migraine, unspecified, not intractable, without status migrainosus: Secondary | ICD-10-CM | POA: Insufficient documentation

## 2011-10-10 DIAGNOSIS — I671 Cerebral aneurysm, nonruptured: Secondary | ICD-10-CM | POA: Insufficient documentation

## 2011-10-10 DIAGNOSIS — Z09 Encounter for follow-up examination after completed treatment for conditions other than malignant neoplasm: Secondary | ICD-10-CM | POA: Insufficient documentation

## 2011-10-10 LAB — BASIC METABOLIC PANEL
BUN: 8 mg/dL (ref 6–23)
Calcium: 9.6 mg/dL (ref 8.4–10.5)
Creatinine, Ser: 0.85 mg/dL (ref 0.50–1.10)
GFR calc Af Amer: >90 mL/min (ref >90)
GFR calc non Af Amer: 88 mL/min — ABNORMAL LOW (ref >90)

## 2011-10-10 LAB — DIFFERENTIAL
Basophils Absolute: 0 10*3/uL (ref 0.0–0.1)
Basophils Relative: 0 % (ref 0–1)
Neutro Abs: 9.7 10*3/uL — ABNORMAL HIGH (ref 1.7–7.7)
Neutrophils Relative %: 89 % — ABNORMAL HIGH (ref 43–77)

## 2011-10-10 LAB — CBC
MCHC: 34.2 g/dL (ref 30.0–36.0)
RDW: 13 % (ref 11.5–15.5)

## 2011-10-10 LAB — PROTIME-INR: INR: 1.09 (ref 0.00–1.49)

## 2011-10-10 MED ORDER — HYDRALAZINE HCL 20 MG/ML IJ SOLN
INTRAMUSCULAR | Status: AC
  Filled 2011-10-10: qty 1

## 2011-10-10 MED ORDER — MIDAZOLAM HCL 2 MG/2ML IJ SOLN
INTRAMUSCULAR | Status: AC
  Administered 2011-10-10: 1 mg
  Filled 2011-10-10: qty 4

## 2011-10-10 MED ORDER — IOHEXOL 300 MG/ML  SOLN
150.0000 mL | Freq: Once | INTRAMUSCULAR | Status: AC | PRN
  Administered 2011-10-10: 45 mL via INTRAVENOUS

## 2011-10-10 MED ORDER — HEPARIN SOD (PORK) LOCK FLUSH 100 UNIT/ML IV SOLN
500.0000 [IU] | Freq: Once | INTRAVENOUS | Status: AC
  Administered 2011-10-10: 500 [IU] via INTRAVENOUS

## 2011-10-10 MED ORDER — FENTANYL CITRATE 0.05 MG/ML IJ SOLN
INTRAMUSCULAR | Status: AC
  Administered 2011-10-10: 25 ug
  Filled 2011-10-10: qty 4

## 2011-10-10 MED ORDER — SODIUM CHLORIDE 0.9 % IV BOLUS (SEPSIS)
250.0000 mL | Freq: Once | INTRAVENOUS | Status: AC
  Administered 2011-10-10: 250 mL via INTRAVENOUS

## 2011-10-10 MED ORDER — SODIUM CHLORIDE 0.9 % IV SOLN: INTRAVENOUS | Status: DC

## 2011-10-10 MED ORDER — FLUMAZENIL 0.5 MG/5ML IV SOLN
INTRAVENOUS | Status: AC
  Filled 2011-10-10: qty 5

## 2011-10-10 NOTE — ED Notes (Signed)
Rt groin at a level "0".  Pressure dressing intact 

## 2011-10-10 NOTE — H&P (Signed)
Katherine [name] is an 36 y.o. female.   Chief Complaint: long history of migraines; previous Bilat periopthalmic aneurysms coiled/stent. Left: 12/2009; Right: 02/2010  HPI: continued migraines; for recheck today  Past Medical History  Diagnosis Date  . Migraine   . Cerebral aneurysm     Past Surgical History  Procedure Date  . Brain surgery     No family history on file. Social History:  reports that she has never smoked. She does not have any smokeless tobacco history on file. She reports that she does not drink alcohol or use illicit drugs.  Allergies:  Allergies  Allergen Reactions  . Ivp Dye (Iodinated Diagnostic Agents)     unknown  . Percocet (Oxycodone-Acetaminophen) Hives and Itching  . Pyridium Plus (Phenazopyridine-Butabarb-Hyosc)     Rash   . Ultram (Tramadol Hcl) Hives and Nausea And Vomiting  . Zomig (Zolmitriptan)     Chest pain  . Codeine Rash  . Ditropan (Oxybutynin Chloride) Rash  . Hydrocodone Nausea And Vomiting and Rash    Medications Prior to Admission  Medication Sig Dispense Refill  . aspirin EC 81 MG tablet Take 81 mg by mouth daily.      . benztropine (COGENTIN) 0.5 MG tablet Take 0.25 mg by mouth 2 (two) times daily.      . clorazepate (TRANXENE) 7.5 MG tablet Take 7.5 mg by mouth 3 (three) times daily.      . DULoxetine (CYMBALTA) 60 MG capsule Take 60 mg by mouth daily.       . hydrOXYzine (VISTARIL) 25 MG capsule Take 25 mg by mouth every 4 (four) hours as needed. For itching      . lamoTRIgine (LAMICTAL) 150 MG tablet Take 150 mg by mouth 2 (two) times daily.      Marland Kitchen lithium 600 MG capsule Take 600 mg by mouth 2 (two) times daily with a meal.      . loratadine (CLARITIN) 10 MG tablet Take 10 mg by mouth daily.      . Melatonin 3 MG TABS Take 3 mg by mouth daily.       . predniSONE (DELTASONE) 50 MG tablet Take 50 mg by mouth See admin instructions. Take 1 tablet 13 hours, 7 hours and 1 hour prior to procedure      . QUEtiapine (SEROQUEL) 400  MG tablet Take 400 mg by mouth at bedtime.      . SUMAtriptan (IMITREX) 100 MG tablet Take 100 mg by mouth every 2 (two) hours as needed. For migraine      . topiramate (TOPAMAX) 50 MG tablet Take 50-100 mg by mouth 2 (two) times daily. Take 50mg  in the  morning and 100mg  at night       No current facility-administered medications on file as of 10/10/2011.    Results for orders placed during the hospital encounter of 10/10/11 (from the past 48 hour(s))  APTT     Status: Normal   Collection Time   10/10/11  8:56 AM      Component Value Range Comment   aPTT 29  24 - 37 (seconds)   CBC     Status: Abnormal   Collection Time   10/10/11  8:56 AM      Component Value Range Comment   WBC 10.9 (*) 4.0 - 10.5 (K/uL)    RBC 4.03  3.87 - 5.11 (MIL/uL)    Hemoglobin 12.5  12.0 - 15.0 (g/dL)    HCT 78.2  95.6 - 21.3 (%)  MCV 90.8  78.0 - 100.0 (fL)    MCH 31.0  26.0 - 34.0 (pg)    MCHC 34.2  30.0 - 36.0 (g/dL)    RDW 16.1  09.6 - 04.5 (%)    Platelets 258  150 - 400 (K/uL)   DIFFERENTIAL     Status: Abnormal   Collection Time   10/10/11  8:56 AM      Component Value Range Comment   Neutrophils Relative 89 (*) 43 - 77 (%)    Neutro Abs 9.7 (*) 1.7 - 7.7 (K/uL)    Lymphocytes Relative 9 (*) 12 - 46 (%)    Lymphs Abs 1.0  0.7 - 4.0 (K/uL)    Monocytes Relative 2 (*) 3 - 12 (%)    Monocytes Absolute 0.2  0.1 - 1.0 (K/uL)    Eosinophils Relative 0  0 - 5 (%)    Eosinophils Absolute 0.0  0.0 - 0.7 (K/uL)    Basophils Relative 0  0 - 1 (%)    Basophils Absolute 0.0  0.0 - 0.1 (K/uL)   PROTIME-INR     Status: Normal   Collection Time   10/10/11  8:56 AM      Component Value Range Comment   Prothrombin Time 14.3  11.6 - 15.2 (seconds)    INR 1.09  0.00 - 1.49     No results found.  Review of Systems  Constitutional: Negative for fever.  Respiratory: Negative for cough.   Cardiovascular: Negative for chest pain.  Gastrointestinal: Negative for nausea, vomiting and abdominal pain.    Neurological: Positive for headaches.    Blood pressure 108/63, pulse 69, temperature 96.7 F (35.9 C), temperature source Oral, resp. rate 20, height 5\' 3"  (1.6 m), weight 170 lb (77.111 kg), last menstrual period 09/22/2011, SpO2 100.00%. Physical Exam  Constitutional: She is oriented to person, place, and time. She appears well-developed and well-nourished.  HENT:  Head: Normocephalic.  Eyes: EOM are normal.  Neck: Normal range of motion. Neck supple.  Cardiovascular: Normal rate, regular rhythm and normal heart sounds.   No murmur heard. Respiratory: Effort normal and breath sounds normal. She has no wheezes.  GI: Soft. Bowel sounds are normal. There is no tenderness.  Musculoskeletal: Normal range of motion.  Neurological: She is alert and oriented to person, place, and time. No cranial nerve deficit. Coordination normal.  Skin: Skin is warm and dry.     Assessment/Plan Bilateral periopthalmic aneurysms coiled and stented.  Continued increasing migraines Scheduled today for cerebral arteriogram and recheck. Pt aware of procedure benefits and risks and agreeable to proceed. Consent signed.  Jayvyn Haselton A 10/10/2011, 9:26 AM

## 2011-10-10 NOTE — ED Notes (Signed)
Rt groin at a level "0".  Pressure dressing intact

## 2011-10-10 NOTE — ED Notes (Signed)
Short stay called to check on bed status. No bed available at this time. Right groin site unchanged. Pt had sprite and peanut butter crackers and tolerated well.

## 2011-10-10 NOTE — ED Notes (Signed)
Discharge instructions discussed with pt and her parents.  Pt taken by wheelchair to parents vehicle

## 2011-10-10 NOTE — ED Notes (Signed)
Right groin site level "0" pressure dressing intact

## 2011-10-10 NOTE — Procedures (Signed)
S/P bilateral carotid arteriograms. Rt CFA approach. Preliminary findings  1. Obliterated bilateral intracranial ICA aneurysms. Full rte port  to follow

## 2011-10-10 NOTE — Discharge Instructions (Signed)
Moderate Sedation, Adult Moderate sedation is given to help you relax or even sleep through a procedure. You may remain sleepy, be clumsy, or have poor balance for several hours following this procedure. Arrange for a responsible adult, family member, or friend to take you home. A responsible adult should stay with you for at least 24 hours or until the medicines have worn off.  Do not participate in any activities where you could become injured for the next 24 hours, or until you feel normal again. Do not:   Drive.   Swim.   Ride a bicycle.   Operate heavy machinery.   Cook.   Use power tools.   Climb ladders.   Work at [employer].   Do not make important decisions or sign legal documents until you are improved.   Vomiting may occur if you eat too soon. When you can drink without vomiting, try water, juice, or soup. Try solid foods if you feel little or no nausea.   Only take over-the-counter or prescription medications for pain, discomfort, or fever as directed by your caregiver.If pain medications have been prescribed for you, ask your caregiver how soon it is safe to take them.   Make sure you and your family fully understands everything about the medication given to you. Make sure you understand what side effects may occur.   You should not drink alcohol, take sleeping pills, or medications that cause drowsiness for at least 24 hours.   If you smoke, do not smoke alone.   If you are feeling better, you may resume normal activities 24 hours after receiving sedation.   Keep all appointments as scheduled. Follow all instructions.   Ask questions if you do not understand.  SEEK MEDICAL CARE IF:   Your skin is pale or bluish in color.   You continue to feel sick to your stomach (nauseous) or throw up (vomit).   Your pain is getting worse and not helped by medication.   You have bleeding or swelling.   You are still sleepy or feeling clumsy after 24 hours.  SEEK IMMEDIATE  MEDICAL CARE IF:   You develop a rash.   You have difficulty breathing.   You develop any type of allergic problem.   You have a fever.  Document Released: 05/06/2001 Document Revised: 04/23/2011 Document Reviewed: 09/27/2007 [facility] Patient Information 2012 [place], [place].  Watch for bleeding in right groin.  If bleeding does start, please apply pressure.

## 2011-10-14 ENCOUNTER — Telehealth (HOSPITAL_COMMUNITY): Payer: Self-pay

## 2011-10-15 ENCOUNTER — Telehealth (HOSPITAL_COMMUNITY): Payer: Self-pay

## 2011-10-15 ENCOUNTER — Ambulatory Visit (HOSPITAL_COMMUNITY): Admission: RE | Admit: 2011-10-15 | Payer: BC Managed Care – PPO | Source: Ambulatory Visit

## 2011-10-15 NOTE — Telephone Encounter (Signed)
Faxed Dr. Curt Bears the final report of Angio 10-15-11  [facility]

## 2011-11-02 IMAGING — CT CT HEAD W/O CM
1 series · 16 of 30 positions shown, 20 images · non-contrast
Comparison: 10/12/2010

CLINICAL DATA: Code stroke/right facial droop.  History of aneurysm
repair

CT HEAD WITHOUT CONTRAST
TECHNIQUE: Contiguous axial images were obtained from the base of
the skull through the vertex without contrast.

[Series 2: (id) head 4.8 h37s st · axial · 0.45mm/px · z∈[-178,-48]mm · 16 of 30 slices shown, 20 images]
[im 2/30  brain]
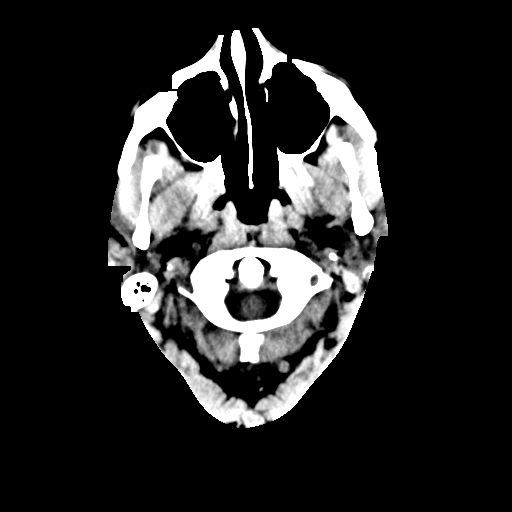
[im 2/30  bone]
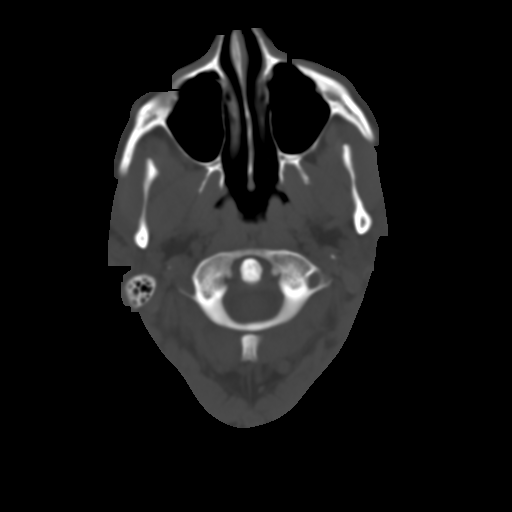
[im 4/30  brain]
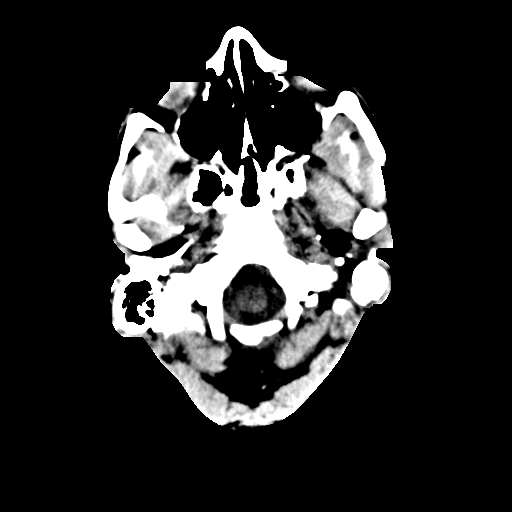
[im 6/30  brain]
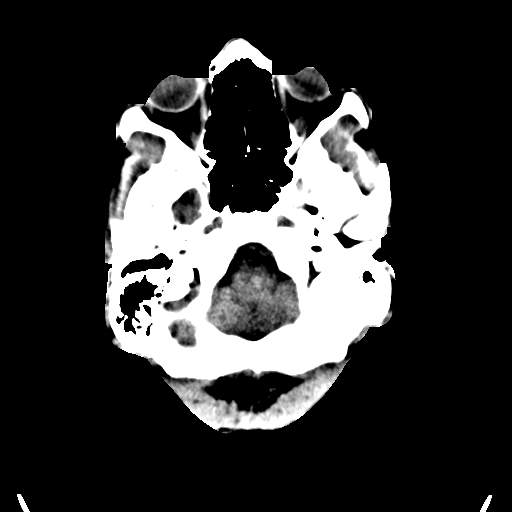
[im 7/30  brain]
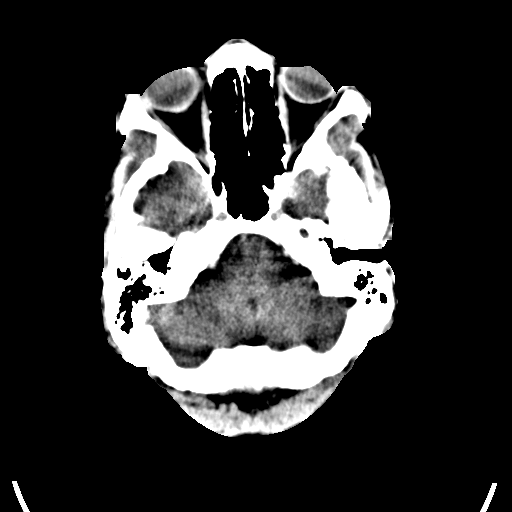
[im 10/30  brain]
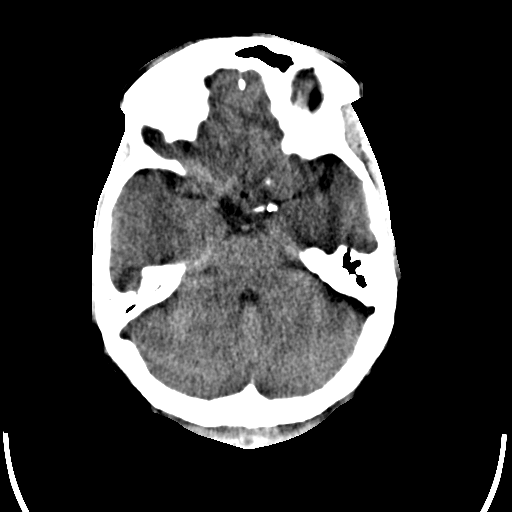
[im 10/30  bone]
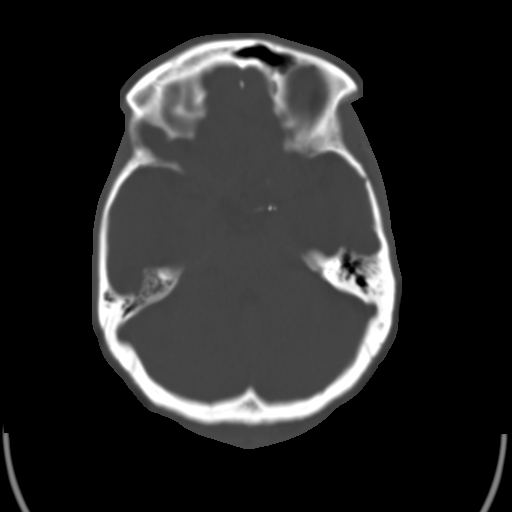
[im 12/30  brain]
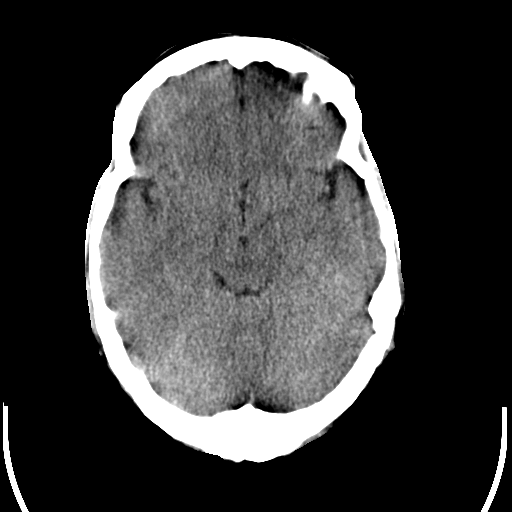
[im 13/30  brain]
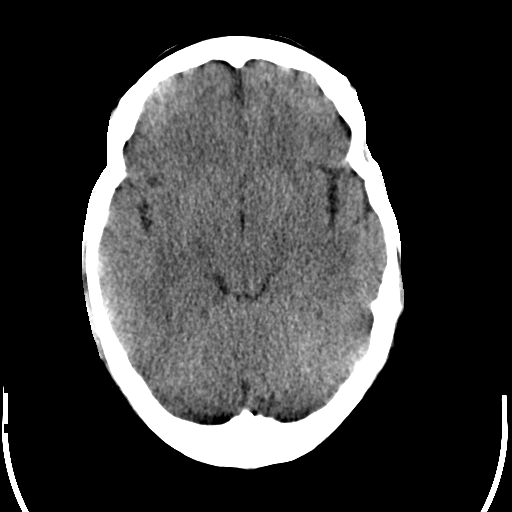
[im 15/30  brain]
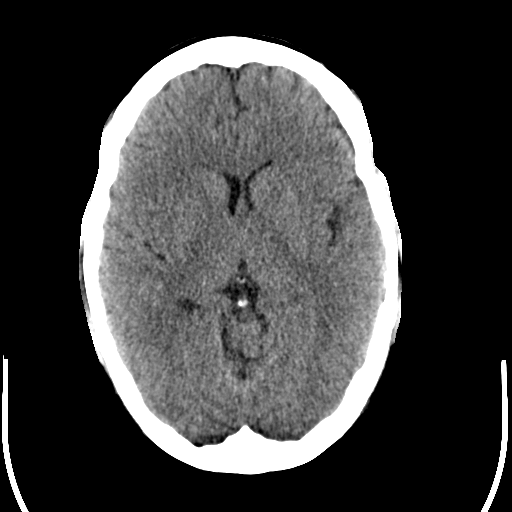
[im 17/30  brain]
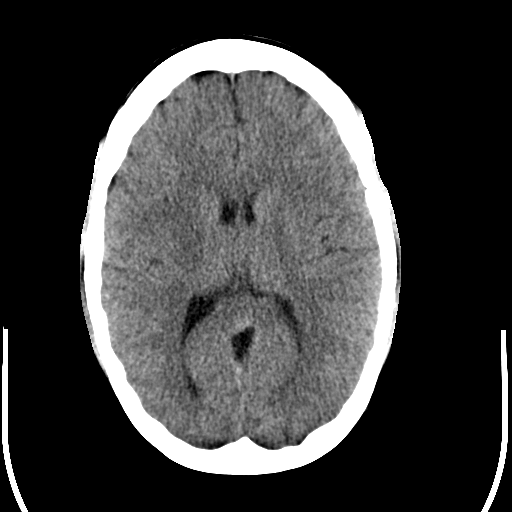
[im 17/30  bone]
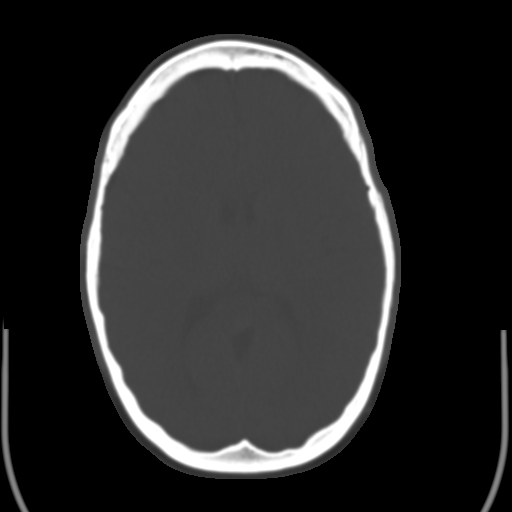
[im 19/30  brain]
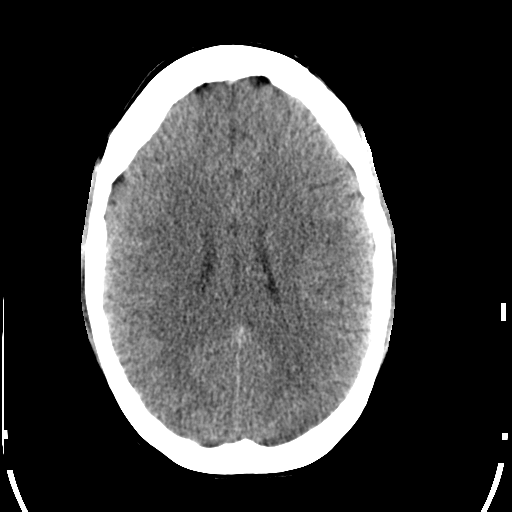
[im 20/30  brain]
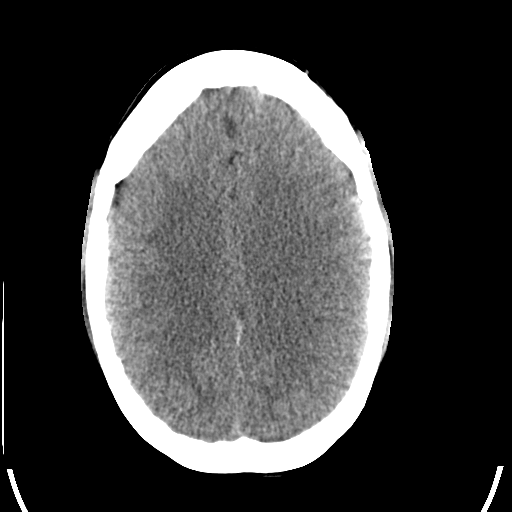
[im 22/30  brain]
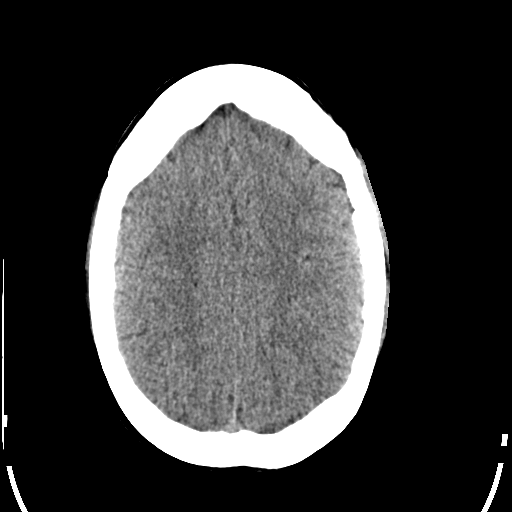
[im 24/30  brain]
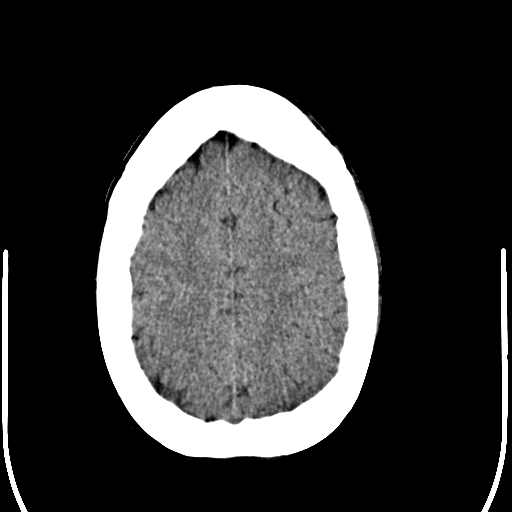
[im 24/30  bone]
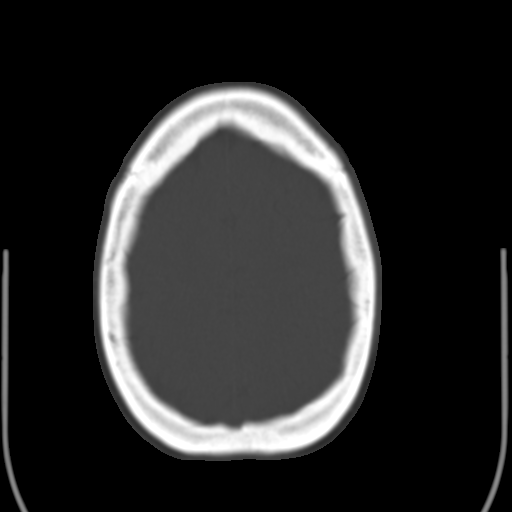
[im 25/30  brain]
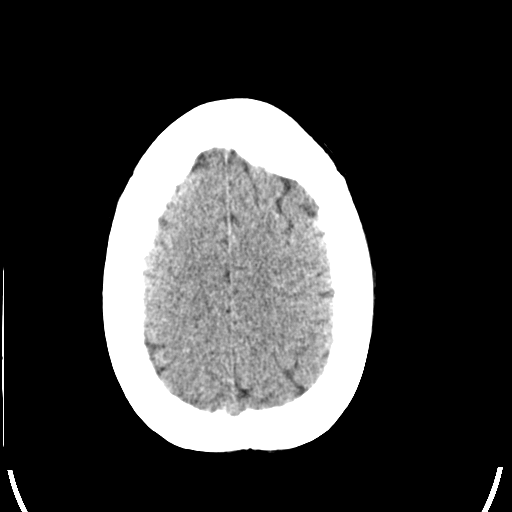
[im 27/30  brain]
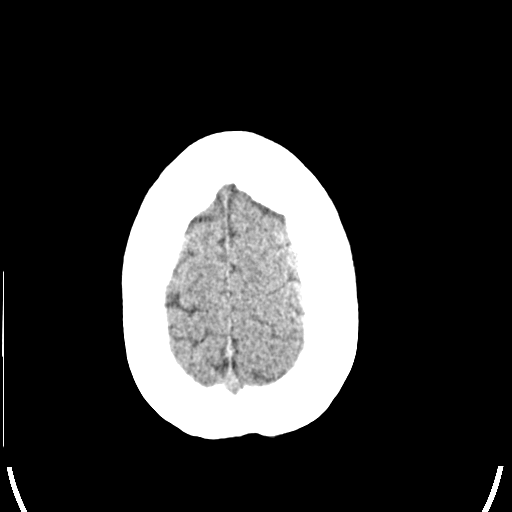
[im 29/30  brain]
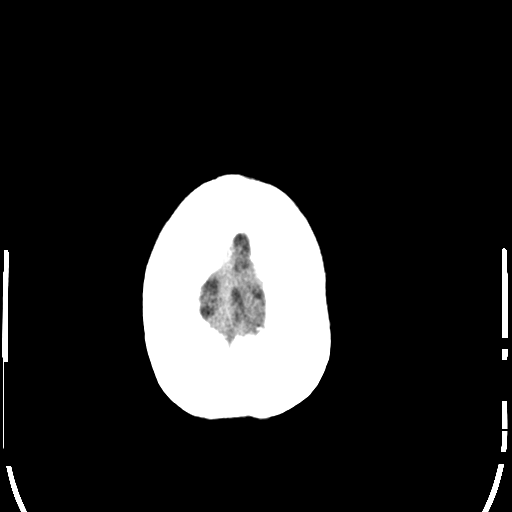

[16 of 30 positions shown; findings below may reference images not displayed]

FINDINGS: Post bilateral carotid artery coils, as before.
Ventricular size and CSF spaces normal.

 No evidence for acute infarct, hemorrhage, or mass lesion. No
extra-axial fluid collections or midline shift.  Calvarium intact.
No fluid in the sinuses visualized.

The results were personally phoned to the attending neurologist
seeing the patient.
IMPRESSION: Post bilateral carotid artery coiling - no obvious acute or focal
intracranial abnormality.

## 2011-11-07 NOTE — Telephone Encounter (Signed)
Contacts         Type  Contact  Phone    10/09/2011 9:17 AM  Phone (Outgoing)  Malen Gauze, Sowmy[place] D (Self)  [phone] (H)    Ms. Allbaugh gave me the pharmacy info to call in her Pre meds for IV contrast// Delmer Islam calling in Pre meds

## 2011-11-11 NOTE — Telephone Encounter (Signed)
10/07/2011 10:39 AM Phone (Outgoing) Melburn Popper (Provider) [phone] Left Message- Left message asking for a disc of the MRI MRA to be mailed to Dr. Corliss Skains so that a [phone] can be preformed 10/07/2011 10:45 AM Phone (Outgoing) Malen Gauze, [place] D (Self) [phone] (H) Left Message- left a message to call office to set up f/u appt about current scans 10/07/2011 1:06 PM Phone (Incoming) Leanord Asal (Provider) [phone] She is mailing the disc today in order for [place] to get the pt set up for angio

## 2011-12-18 ENCOUNTER — Emergency Department (HOSPITAL_BASED_OUTPATIENT_CLINIC_OR_DEPARTMENT_OTHER)
Admission: EM | Admit: 2011-12-18 | Discharge: 2011-12-18 | Disposition: A | Discharge status: Home / Self Care | Payer: BC Managed Care – PPO | Attending: Emergency Medicine | Admitting: Emergency Medicine

## 2011-12-18 ENCOUNTER — Encounter (HOSPITAL_BASED_OUTPATIENT_CLINIC_OR_DEPARTMENT_OTHER): Payer: Self-pay | Admitting: *Deleted

## 2011-12-18 DIAGNOSIS — K5289 Other specified noninfective gastroenteritis and colitis: Secondary | ICD-10-CM | POA: Insufficient documentation

## 2011-12-18 DIAGNOSIS — K529 Noninfective gastroenteritis and colitis, unspecified: Secondary | ICD-10-CM

## 2011-12-18 LAB — URINALYSIS, ROUTINE W REFLEX MICROSCOPIC
Glucose, UA: NEGATIVE mg/dL
Leukocytes, UA: NEGATIVE
Nitrite: NEGATIVE
Protein, ur: 100 mg/dL — AB
Protein, ur: NEGATIVE mg/dL
Specific Gravity, Urine: 1.01 (ref 1.005–1.030)
Specific Gravity, Urine: 1.008 (ref 1.005–1.030)
Urobilinogen, UA: 1 mg/dL (ref 0.0–1.0)

## 2011-12-18 LAB — URINE MICROSCOPIC-ADD ON

## 2011-12-18 LAB — PREGNANCY, URINE: Preg Test, Ur: NEGATIVE

## 2011-12-18 MED ORDER — SODIUM CHLORIDE 0.9 % IV BOLUS (SEPSIS)
1000.0000 mL | Freq: Once | INTRAVENOUS | Status: AC
  Administered 2011-12-18 (×2): 1000 mL via INTRAVENOUS

## 2011-12-18 MED ORDER — SODIUM CHLORIDE 0.9 % IV SOLN
INTRAVENOUS | Status: AC
  Administered 2011-12-18: 1000 mL via INTRAVENOUS
  Filled 2011-12-18: qty 25

## 2011-12-18 MED ORDER — SODIUM CHLORIDE 0.9 % IV BOLUS (SEPSIS): 1000.0000 mL | Freq: Once | INTRAVENOUS | Status: DC

## 2011-12-18 MED ORDER — FAMOTIDINE IN NACL 20-0.9 MG/50ML-% IV SOLN
20.0000 mg | Freq: Once | INTRAVENOUS | Status: AC
  Administered 2011-12-18: 20 mg via INTRAVENOUS
  Filled 2011-12-18: qty 50

## 2011-12-18 MED ORDER — PROMETHAZINE HCL 25 MG/ML IJ SOLN
25.0000 mg | Freq: Once | INTRAMUSCULAR | Status: AC
  Administered 2011-12-18: 25 mg via INTRAVENOUS
  Filled 2011-12-18: qty 1

## 2011-12-18 MED ORDER — ONDANSETRON HCL 4 MG/2ML IJ SOLN
4.0000 mg | Freq: Once | INTRAMUSCULAR | Status: AC
  Administered 2011-12-18: 4 mg via INTRAVENOUS
  Filled 2011-12-18: qty 2

## 2011-12-18 MED ORDER — ONDANSETRON HCL 4 MG PO TABS: 4.0000 mg | ORAL_TABLET | Freq: Four times a day (QID) | ORAL | Status: AC

## 2011-12-18 NOTE — ED Notes (Signed)
Patient states she developed vomiting yesterday and approximately one hour pta developed diarrhea.  C/O intermittent crampy abdominal pain.

## 2011-12-18 NOTE — ED Notes (Signed)
Pt. Complaining of nausea and feeling like she is going to "vomit".  Pt. Is in no distress with mother at bedside.

## 2011-12-18 NOTE — ED Notes (Signed)
Patient ambulatory to restroom with standby assistance.

## 2011-12-18 NOTE — Discharge Instructions (Signed)
Viral Gastroenteritis Viral gastroenteritis is also known as stomach flu. This condition affects the stomach and intestinal tract. It can cause sudden diarrhea and vomiting. The illness typically lasts 3 to 8 days. Most people develop an immune response that eventually gets rid of the virus. While this natural response develops, the virus can make you quite ill. CAUSES  Many different viruses can cause gastroenteritis, such as rotavirus or noroviruses. You can catch one of these viruses by consuming contaminated food or water. You may also catch a virus by sharing utensils or other personal items with an infected person or by touching a contaminated surface. SYMPTOMS  The most common symptoms are diarrhea and vomiting. These problems can cause a severe loss of body fluids (dehydration) and a body salt (electrolyte) imbalance. Other symptoms may include:  Fever.   Headache.   Fatigue.   Abdominal pain.  DIAGNOSIS  Your caregiver can usually diagnose viral gastroenteritis based on your symptoms and a physical exam. A stool sample may also be taken to test for the presence of viruses or other infections. TREATMENT  This illness typically goes away on its own. Treatments are aimed at rehydration. The most serious cases of viral gastroenteritis involve vomiting so severely that you are not able to keep fluids down. In these cases, fluids must be given through an intravenous line (IV). HOME CARE INSTRUCTIONS   Drink enough fluids to keep your urine clear or pale yellow. Drink small amounts of fluids frequently and increase the amounts as tolerated.   Ask your caregiver for specific rehydration instructions.   Avoid:   Foods high in sugar.   Alcohol.   Carbonated drinks.   Tobacco.   Juice.   Caffeine drinks.   Extremely hot or cold fluids.   Fatty, greasy foods.   Too much intake of anything at one time.   Dairy products until 24 to 48 hours after diarrhea stops.   You may  consume probiotics. Probiotics are active cultures of beneficial bacteria. They may lessen the amount and number of diarrheal stools in adults. Probiotics can be found in yogurt with active cultures and in supplements.   Wash your hands well to avoid spreading the virus.   Only take over-the-counter or prescription medicines for pain, discomfort, or fever as directed by your caregiver. Do not give aspirin to children. Antidiarrheal medicines are not recommended.   Ask your caregiver if you should continue to take your regular prescribed and over-the-counter medicines.   Keep all follow-up appointments as directed by your caregiver.  SEEK IMMEDIATE MEDICAL CARE IF:   You are unable to keep fluids down.   You do not urinate at least once every 6 to 8 hours.   You develop shortness of breath.   You notice blood in your stool or vomit. This may look like coffee grounds.   You have abdominal pain that increases or is concentrated in one small area (localized).   You have persistent vomiting or diarrhea.   You have a fever.   The patient is a child younger than 3 months, and he or she has a fever.   The patient is a child older than 3 months, and he or she has a fever and persistent symptoms.   The patient is a child older than 3 months, and he or she has a fever and symptoms suddenly get worse.   The patient is a baby, and he or she has no tears when crying.  MAKE SURE YOU:     Understand these instructions.   Will watch your condition.   Will get help right away if you are not doing well or get worse.  Document Released: 08/11/2005 Document Revised: 07/31/2011 Document Reviewed: 05/28/2011 [employer] Patient Information 2012 [employer]. 

## 2011-12-18 NOTE — ED Notes (Signed)
Encouraged Pt. To give another urine sample if she can.

## 2011-12-18 NOTE — ED Provider Notes (Addendum)
History     CSN: [id number]  Arrival date & time 12/18/11  1131   First MD Initiated Contact with Patient 12/18/11 1148      Chief Complaint  Patient presents with  . Emesis    (Consider location/radiation/quality/duration/timing/severity/associated sxs/prior treatment) HPI Comments: Patient had onset of nausea and vomiting yesterday.  It has been persistent since then.  Nonbloody and non-coffee-ground emesis has been noted.  No specific fevers at home.  No specific sick contacts.  Diarrhea began just prior to arrival.  Patient has abdominal pain only with vomiting but none in between episodes.  No prior abdominal surgeries.  Patient has been unable to maintain by mouth intake at home due to the nausea and vomiting.  She has also been unable to take her home medications due to the nausea and vomiting.  Patient is a 36 y.o. female presenting with vomiting. The history is provided by the patient. No language interpreter was used.  Emesis  This is a new problem. The current episode started yesterday. The problem occurs 5 to 10 times per day. The problem has not changed since onset.The emesis has an appearance of stomach contents. There has been no fever. Associated symptoms include abdominal pain and diarrhea. Pertinent negatives include no arthralgias, no chills, no cough, no fever, no headaches, no myalgias, no sweats and no URI.    Past Medical History  Diagnosis Date  . Migraine   . Cerebral aneurysm     Past Surgical History  Procedure Date  . Brain surgery   . Cholecystectomy     History reviewed. No pertinent family history.  History  Substance Use Topics  . Smoking status: Never Smoker   . Smokeless tobacco: Not on file  . Alcohol Use: No    OB History    Grav Para Term Preterm Abortions TAB SAB Ect Mult Living                  Review of Systems  Constitutional: Negative.  Negative for fever and chills.  HENT: Negative.   Eyes: Negative.  Negative for  discharge and redness.  Respiratory: Negative.  Negative for cough and shortness of breath.   Cardiovascular: Negative.  Negative for chest pain.  Gastrointestinal: Positive for nausea, vomiting, abdominal pain and diarrhea.  Genitourinary: Negative.  Negative for dysuria and vaginal discharge.  Musculoskeletal: Negative.  Negative for myalgias, back pain and arthralgias.  Skin: Negative.  Negative for color change and rash.  Neurological: Negative.  Negative for syncope and headaches.  Hematological: Negative.  Negative for adenopathy.  Psychiatric/Behavioral: Negative.  Negative for confusion.  All other systems reviewed and are negative.    Allergies  Ivp dye; Percocet; Pyridium plus; Ultram; Zomig; Codeine; Ditropan; and Hydrocodone  Home Medications   Current Outpatient Rx  Name Route Sig Dispense Refill  . ASPIRIN EC 81 MG PO TBEC Oral Take 81 mg by mouth daily.    Marland Kitchen BENZTROPINE MESYLATE 0.5 MG PO TABS Oral Take 0.25 mg by mouth 2 (two) times daily.    Marland Kitchen CLORAZEPATE DIPOTASSIUM 7.5 MG PO TABS Oral Take 7.5 mg by mouth 3 (three) times daily.    . DULOXETINE HCL 60 MG PO CPEP Oral Take 60 mg by mouth daily.     Marland Kitchen HYDROXYZINE PAMOATE 25 MG PO CAPS Oral Take 25 mg by mouth every 4 (four) hours as needed. For itching    . LAMOTRIGINE 150 MG PO TABS Oral Take 150 mg by mouth 2 (two) times  daily.    Marland Kitchen LITHIUM CARBONATE 600 MG PO CAPS Oral Take 600 mg by mouth 2 (two) times daily with a meal.    . LORATADINE 10 MG PO TABS Oral Take 10 mg by mouth daily.    Marland Kitchen MELATONIN 3 MG PO TABS Oral Take 3 mg by mouth daily.     . QUETIAPINE FUMARATE 400 MG PO TABS Oral Take 400 mg by mouth at bedtime.    . SUMATRIPTAN SUCCINATE 100 MG PO TABS Oral Take 100 mg by mouth every 2 (two) hours as needed. For migraine    . TOPIRAMATE 50 MG PO TABS Oral Take 50-100 mg by mouth 2 (two) times daily. Take 50mg  in the  morning and 100mg  at night      BP 113/74  Pulse 80  Temp(Src) 97.6 F (36.4 C) (Oral)   Resp 20  Ht 5\' 3"  (1.6 m)  Wt 175 lb (79.379 kg)  BMI 31.00 kg/m2  SpO2 98%  LMP 12/18/2011  Physical Exam  Nursing note and vitals reviewed. Constitutional: She is oriented to person, place, and time. She appears well-developed and well-nourished.  Non-toxic appearance. She does not have a sickly appearance.  HENT:  Head: Normocephalic and atraumatic.  Eyes: Conjunctivae, EOM and lids are normal. Pupils are equal, round, and reactive to light. No scleral icterus.  Neck: Trachea normal and normal range of motion. Neck supple.  Cardiovascular: Normal rate, regular rhythm and normal heart sounds.   Pulmonary/Chest: Effort normal and breath sounds normal. No respiratory distress. She has no wheezes. She has no rales.  Abdominal: Soft. Normal appearance. There is no tenderness. There is no rebound, no guarding and no CVA tenderness.  Musculoskeletal: Normal range of motion.  Neurological: She is alert and oriented to person, place, and time. She has normal strength.  Skin: Skin is warm, dry and intact. No rash noted.  Psychiatric: She has a normal mood and affect. Her behavior is normal. Judgment and thought content normal.    ED Course  Procedures (including critical care time)  Results for orders placed during the hospital encounter of 12/18/11  URINALYSIS, ROUTINE W REFLEX MICROSCOPIC      Component Value Range   Color, Urine YELLOW  YELLOW    APPearance CLOUDY (*) CLEAR    Specific Gravity, Urine 1.010  1.005 - 1.030    pH 7.5  5.0 - 8.0    Glucose, UA NEGATIVE  NEGATIVE (mg/dL)   Hgb urine dipstick LARGE (*) NEGATIVE    Bilirubin Urine NEGATIVE  NEGATIVE    Ketones, ur NEGATIVE  NEGATIVE (mg/dL)   Protein, ur 161 (*) NEGATIVE (mg/dL)   Urobilinogen, UA 1.0  0.0 - 1.0 (mg/dL)   Nitrite NEGATIVE  NEGATIVE    Leukocytes, UA LARGE (*) NEGATIVE   PREGNANCY, URINE      Component Value Range   Preg Test, Ur NEGATIVE  NEGATIVE   URINE MICROSCOPIC-ADD ON      Component Value  Range   Squamous Epithelial / LPF FEW (*) RARE    WBC, UA 11-20  <3 (WBC/hpf)   RBC / HPF 21-50  <3 (RBC/hpf)   Bacteria, UA MANY (*) RARE    Crystals TRIPLE PHOSPHATE CRYSTALS (*) NEGATIVE    Urine-Other LESS THAN 10 mL OF URINE SUBMITTED         MDM  Patient with likely viral gastroenteritis at this time.  She has a benign abdominal exam.  I will treat her symptoms with Zofran for her  nausea, IV fluids for her dehydration and Pepcid for her acid reflux type symptoms.  I anticipate the patient will likely be able to be discharged once these interventions are completed.   Nat Christen, MD 12/18/11 1203  Patient's nausea is improving after the Phenergan that was given that initial dose of Zofran for the patient's persistent nausea.  Patient's urinalysis is likely contaminated  and related to the fact that she is on her menses currently.  We will recheck a urine sample when the patient is able to provide an increased amount of urine to rule out UTI.    Nat Christen, MD 12/18/11 [id number]

## 2012-01-25 ENCOUNTER — Encounter (HOSPITAL_COMMUNITY): Payer: Self-pay | Admitting: Emergency Medicine

## 2012-01-25 ENCOUNTER — Emergency Department (HOSPITAL_COMMUNITY)
Admission: EM | Admit: 2012-01-25 | Discharge: 2012-01-25 | Disposition: A | Discharge status: Home / Self Care | Payer: BC Managed Care – PPO | Attending: Emergency Medicine | Admitting: Emergency Medicine

## 2012-01-25 DIAGNOSIS — G43909 Migraine, unspecified, not intractable, without status migrainosus: Secondary | ICD-10-CM

## 2012-01-25 MED ORDER — METOCLOPRAMIDE HCL 5 MG/ML IJ SOLN
10.0000 mg | Freq: Once | INTRAMUSCULAR | Status: AC
Start: 2012-01-25 — End: 2012-01-25
  Administered 2012-01-25: 10 mg via INTRAVENOUS
  Filled 2012-01-25: qty 2

## 2012-01-25 MED ORDER — DEXAMETHASONE SODIUM PHOSPHATE 10 MG/ML IJ SOLN
10.0000 mg | Freq: Once | INTRAMUSCULAR | Status: AC
  Administered 2012-01-25: 10 mg via INTRAVENOUS
  Filled 2012-01-25: qty 1

## 2012-01-25 MED ORDER — DIPHENHYDRAMINE HCL 50 MG/ML IJ SOLN
25.0000 mg | Freq: Once | INTRAMUSCULAR | Status: AC
  Administered 2012-01-25: 25 mg via INTRAVENOUS
  Filled 2012-01-25: qty 2

## 2012-01-25 NOTE — ED Notes (Signed)
Patients dad is at the bedside. NAD noted at this time.

## 2012-01-25 NOTE — ED Provider Notes (Signed)
Medical screening examination/treatment/procedure(s) were performed by non-physician practitioner and as supervising physician I was immediately available for consultation/collaboration.   Elwin Tsou, MD 01/25/12 1529 

## 2012-01-25 NOTE — Discharge Instructions (Signed)
FOLLOW UP WITH YOUR NEUROLOGIST FOR FURTHER EVALUATION IF HEADACHE RECURS. RETURN HERE AS NEEDED FOR NEW OR CONCERNING SYMPTOMS.   Migraine Headache A migraine headache is an intense, throbbing pain on one or both sides of your head. The exact cause of a migraine headache is not always known. A migraine may be caused when nerves in the brain become irritated and release chemicals that cause swelling within blood vessels, causing pain. Many migraine sufferers have a family history of migraines. Before you get a migraine you may or may not get an aura. An aura is a group of symptoms that can predict the beginning of a migraine. An aura may include:  Visual changes such as:   Flashing lights.   Bright spots or zig-zag lines.   Tunnel vision.   Feelings of numbness.   Trouble talking.   Muscle weakness.  SYMPTOMS  Pain on one or both sides of your head.   Pain that is pulsating or throbbing in nature.   Pain that is severe enough to prevent daily activities.   Pain that is aggravated by any daily physical activity.   Nausea (feeling sick to your stomach), vomiting, or both.   Pain with exposure to bright lights, loud noises, or activity.   General sensitivity to bright lights or loud noises.  MIGRAINE TRIGGERS Examples of triggers of migraine headaches include:   Alcohol.   Smoking.   Stress.   It may be related to menses (female menstruation).   Aged cheeses.   Foods or drinks that contain nitrates, glutamate, aspartame, or tyramine.   Lack of sleep.   Chocolate.   Caffeine.   Hunger.   Medications such as nitroglycerine (used to treat chest pain), birth control pills, estrogen, and some blood pressure medications.  DIAGNOSIS  A migraine headache is often diagnosed based on:  Symptoms.   Physical examination.   A computerized X-ray scan (computed tomography, CT) of your head.  TREATMENT  Medications can help prevent migraines if they are recurrent or  should they become recurrent. Your caregiver can help you with a medication or treatment program that will be helpful to you.   Lying down in a dark, quiet room may be helpful.   Keeping a headache diary may help you find a trend as to what may be triggering your headaches.  SEEK IMMEDIATE MEDICAL CARE IF:   You have confusion, personality changes or seizures.   You have headaches that wake you from sleep.   You have an increased frequency in your headaches.   You have a stiff neck.   You have a loss of vision.   You have muscle weakness.   You start losing your balance or have trouble walking.   You feel faint or pass out.  MAKE SURE YOU:   Understand these instructions.   Will watch your condition.   Will get help right away if you are not doing well or get worse.  Document Released: 08/11/2005 Document Revised: 07/31/2011 Document Reviewed: 03/27/2009 [facility] Patient Information 2012 [place], [place].

## 2012-01-25 NOTE — ED Provider Notes (Signed)
History     CSN: [id number]  Arrival date & time 01/25/12  1134   First MD Initiated Contact with Patient 01/25/12 1138      Chief Complaint  Patient presents with  . Migraine    (Consider location/radiation/quality/duration/timing/severity/associated sxs/prior treatment) Patient is a 36 y.o. female presenting with migraine. The history is provided by the patient.  Migraine This is a new problem. The current episode started today. The problem occurs constantly. The problem has been unchanged. Associated symptoms include headaches, numbness and weakness. Pertinent negatives include no chest pain, fever, nausea or vomiting. Associated symptoms comments: She describes a migraine headache like she has had in the past associated with right sided numbness and difficulty speaking. She felt unable to focus and confused. She found it difficult to move right side of body. She states symptoms lasted about 10-15 minutes and now just the headache persists. She reiterates that this is typical of her migraine headaches. .    Past Medical History  Diagnosis Date  . Migraine   . Cerebral aneurysm     Past Surgical History  Procedure Date  . Brain surgery   . Cholecystectomy     No family history on file.  History  Substance Use Topics  . Smoking status: Never Smoker   . Smokeless tobacco: Not on file  . Alcohol Use: No    OB History    Grav Para Term Preterm Abortions TAB SAB Ect Mult Living                  Review of Systems  Constitutional: Negative for fever.  Respiratory: Negative for shortness of breath.   Cardiovascular: Negative for chest pain.  Gastrointestinal: Negative for nausea and vomiting.  Neurological: Positive for speech difficulty, weakness, numbness and headaches.  Psychiatric/Behavioral: Positive for confusion.    Allergies  Ivp dye; Percocet; Pyridium plus; Ultram; Zomig; Codeine; Ditropan; and Hydrocodone  Home Medications   Current Outpatient Rx    Name Route Sig Dispense Refill  . ASPIRIN EC 81 MG PO TBEC Oral Take 81 mg by mouth daily.    Marland Kitchen CETIRIZINE HCL 10 MG PO TABS Oral Take 10 mg by mouth daily.    . DULOXETINE HCL 60 MG PO CPEP Oral Take 60 mg by mouth daily.     Marland Kitchen HYDROXYZINE PAMOATE 25 MG PO CAPS Oral Take 25 mg by mouth every 4 (four) hours as needed. For anxiety    . LAMOTRIGINE 150 MG PO TABS Oral Take 150 mg by mouth 2 (two) times daily.    Marland Kitchen LITHIUM CARBONATE 600 MG PO CAPS Oral Take 600 mg by mouth 2 (two) times daily with a meal.    . MELATONIN 3 MG PO TABS Oral Take 3 mg by mouth daily.     . SUMATRIPTAN SUCCINATE 100 MG PO TABS Oral Take 100 mg by mouth every 2 (two) hours as needed. For migraine    . TOPIRAMATE 50 MG PO TABS Oral Take 50-100 mg by mouth 2 (two) times daily. Take 50mg  in the  morning and 100mg  at night      BP 96/58  Pulse 71  Temp(Src) 98 F (36.7 C) (Oral)  Resp 20  SpO2 99%  Physical Exam  Constitutional: She is oriented to person, place, and time. She appears well-developed and well-nourished.  HENT:  Head: Normocephalic.  Eyes: Pupils are equal, round, and reactive to light.  Neck: Normal range of motion. Neck supple.  Cardiovascular: Normal rate and regular  rhythm.   Pulmonary/Chest: Effort normal and breath sounds normal.  Abdominal: Soft. Bowel sounds are normal. There is no tenderness. There is no rebound and no guarding.  Musculoskeletal: Normal range of motion.  Neurological: She is alert and oriented to person, place, and time. She has normal strength and normal reflexes. She displays normal reflexes. No sensory deficit. She displays a negative Romberg sign. Coordination normal.  Skin: Skin is warm and dry. No rash noted.  Psychiatric: She has a normal mood and affect.    ED Course  Procedures (including critical care time)  Labs Reviewed - No data to display No results found.   No diagnosis found.  1. Migraine headache   MDM  Headache is improved with cocktail  medications. Family here to drive her home and patient states she feels improved enough for discharge.        Rodena Medin, PA-C 01/25/12 1525

## 2012-01-25 NOTE — ED Notes (Signed)
Patient appears to be sleeping. Vital signs stable

## 2012-01-25 NOTE — ED Notes (Signed)
Called out for unconscious while at church; EMS - found pt. In w/c and alert, tingling in feet. cbg 84; shooting pain in back of neck, radiating to head. Negative stroke scale. Hx. Of cerebral aneurysms. Confusion on scene - word finding, and talking clearly now. Hx. Of migraines.

## 2012-02-05 ENCOUNTER — Ambulatory Visit (HOSPITAL_COMMUNITY)
Admission: RE | Admit: 2012-02-05 | Discharge: 2012-02-05 | Disposition: A | Discharge status: Home / Self Care | Payer: BC Managed Care – PPO | Source: Ambulatory Visit | Attending: Obstetrics and Gynecology | Admitting: Obstetrics and Gynecology

## 2012-02-05 ENCOUNTER — Other Ambulatory Visit (HOSPITAL_COMMUNITY): Payer: Self-pay | Admitting: Obstetrics and Gynecology

## 2012-02-05 DIAGNOSIS — R102 Pelvic and perineal pain: Secondary | ICD-10-CM

## 2012-02-05 DIAGNOSIS — D251 Intramural leiomyoma of uterus: Secondary | ICD-10-CM | POA: Insufficient documentation

## 2012-02-05 DIAGNOSIS — R1032 Left lower quadrant pain: Secondary | ICD-10-CM | POA: Insufficient documentation

## 2012-02-05 DIAGNOSIS — N949 Unspecified condition associated with female genital organs and menstrual cycle: Secondary | ICD-10-CM | POA: Insufficient documentation

## 2012-02-05 DIAGNOSIS — N938 Other specified abnormal uterine and vaginal bleeding: Secondary | ICD-10-CM | POA: Insufficient documentation

## 2012-07-15 ENCOUNTER — Emergency Department (HOSPITAL_COMMUNITY)
Admission: EM | Admit: 2012-07-15 | Discharge: 2012-07-15 | Disposition: A | Discharge status: Home / Self Care | Payer: BC Managed Care – PPO | Attending: Emergency Medicine | Admitting: Emergency Medicine

## 2012-07-15 ENCOUNTER — Encounter (HOSPITAL_COMMUNITY): Payer: Self-pay | Admitting: Emergency Medicine

## 2012-07-15 DIAGNOSIS — R5381 Other malaise: Secondary | ICD-10-CM | POA: Insufficient documentation

## 2012-07-15 DIAGNOSIS — G43909 Migraine, unspecified, not intractable, without status migrainosus: Secondary | ICD-10-CM | POA: Insufficient documentation

## 2012-07-15 DIAGNOSIS — Z79899 Other long term (current) drug therapy: Secondary | ICD-10-CM | POA: Insufficient documentation

## 2012-07-15 DIAGNOSIS — R110 Nausea: Secondary | ICD-10-CM | POA: Insufficient documentation

## 2012-07-15 DIAGNOSIS — R5383 Other fatigue: Secondary | ICD-10-CM | POA: Insufficient documentation

## 2012-07-15 DIAGNOSIS — Z7982 Long term (current) use of aspirin: Secondary | ICD-10-CM | POA: Insufficient documentation

## 2012-07-15 DIAGNOSIS — R51 Headache: Secondary | ICD-10-CM | POA: Insufficient documentation

## 2012-07-15 DIAGNOSIS — Z8669 Personal history of other diseases of the nervous system and sense organs: Secondary | ICD-10-CM | POA: Insufficient documentation

## 2012-07-15 DIAGNOSIS — R29810 Facial weakness: Secondary | ICD-10-CM | POA: Insufficient documentation

## 2012-07-15 DIAGNOSIS — R4789 Other speech disturbances: Secondary | ICD-10-CM | POA: Insufficient documentation

## 2012-07-15 LAB — CBC WITH DIFFERENTIAL/PLATELET
Basophils Absolute: 0 10*3/uL (ref 0.0–0.1)
Basophils Relative: 0 % (ref 0–1)
HCT: 33.3 % — ABNORMAL LOW (ref 36.0–46.0)
Lymphocytes Relative: 36 % (ref 12–46)
MCHC: 34.2 g/dL (ref 30.0–36.0)
Monocytes Absolute: 0.4 10*3/uL (ref 0.1–1.0)
Neutro Abs: 3.5 10*3/uL (ref 1.7–7.7)
Platelets: 215 10*3/uL (ref 150–400)
RDW: 12.6 % (ref 11.5–15.5)
WBC: 6.2 10*3/uL (ref 4.0–10.5)

## 2012-07-15 LAB — URINALYSIS, ROUTINE W REFLEX MICROSCOPIC
Bilirubin Urine: NEGATIVE
Ketones, ur: NEGATIVE mg/dL
Nitrite: NEGATIVE
Specific Gravity, Urine: 1.012 (ref 1.005–1.030)
Urobilinogen, UA: 0.2 mg/dL (ref 0.0–1.0)
pH: 7.5 (ref 5.0–8.0)

## 2012-07-15 LAB — COMPREHENSIVE METABOLIC PANEL
AST: 19 U/L (ref 0–37)
Albumin: 3.6 g/dL (ref 3.5–5.2)
BUN: 12 mg/dL (ref 6–23)
Creatinine, Ser: 0.86 mg/dL (ref 0.50–1.10)
Total Protein: 6.3 g/dL (ref 6.0–8.3)

## 2012-07-15 LAB — URINE MICROSCOPIC-ADD ON

## 2012-07-15 LAB — POCT PREGNANCY, URINE: Preg Test, Ur: NEGATIVE

## 2012-07-15 MED ORDER — METOCLOPRAMIDE HCL 5 MG/ML IJ SOLN
10.0000 mg | Freq: Once | INTRAMUSCULAR | Status: AC
  Administered 2012-07-15: 10 mg via INTRAVENOUS
  Filled 2012-07-15: qty 2

## 2012-07-15 MED ORDER — SODIUM CHLORIDE 0.9 % IV BOLUS (SEPSIS)
1000.0000 mL | Freq: Once | INTRAVENOUS | Status: AC
  Administered 2012-07-15: 1000 mL via INTRAVENOUS

## 2012-07-15 MED ORDER — DIPHENHYDRAMINE HCL 50 MG/ML IJ SOLN
25.0000 mg | Freq: Once | INTRAMUSCULAR | Status: AC
  Administered 2012-07-15: 25 mg via INTRAVENOUS
  Filled 2012-07-15: qty 1

## 2012-07-15 MED ORDER — DEXAMETHASONE SODIUM PHOSPHATE 10 MG/ML IJ SOLN
10.0000 mg | Freq: Once | INTRAMUSCULAR | Status: AC
  Administered 2012-07-15: 10 mg via INTRAVENOUS
  Filled 2012-07-15: qty 1

## 2012-07-15 NOTE — ED Notes (Signed)
Patient from home via [facility] c/o of severe migraine.  Patient does have right sided weakness and left sided facial droop.  Patient states "this is normal for me with my migraines, I get these symptoms every time with severe migraines".  Hx. Of brain auerysems with stent placement and severe headaches.

## 2012-07-15 NOTE — ED Provider Notes (Signed)
History     CSN: [id number]  Arrival date & time 07/15/12  1650   First MD Initiated Contact with Patient 07/15/12 1651      No chief complaint on file.   (Consider location/radiation/quality/duration/timing/severity/associated sxs/prior treatment) HPI: Katherine [name] is a 36 yo CF with history of complex migraines, depression and cerebral aneurysms s/p coiling presents with headache and right arm weakness. Symptoms started at 330 pm with head pain located in the occiput. Pain described as dull, constant, severe, radiates to base of skull but not neck, no alleviating factors. She denies photophobia, sound sensivity or vomiting. Does endorse mild nausea. She normally takes Imitrex but was out. Short episode of slurred speech that has now resolved. Also noted to have right facial droop and right arm weakness. She states this is typical when she has a complex migraine. Most recently this summer. She underwent angiogram 9 months ago with patent vessels, no new aneurysms.   Past Medical History  Diagnosis Date  . Migraine   . Cerebral aneurysm     Past Surgical History  Procedure Date  . Brain surgery   . Cholecystectomy     No family history on file.  History  Substance Use Topics  . Smoking status: Never Smoker   . Smokeless tobacco: Not on file  . Alcohol Use: No    OB History    Grav Para Term Preterm Abortions TAB SAB Ect Mult Living                  Review of Systems  Constitutional: Positive for appetite change (decreased today). Negative for fever, chills and fatigue.  HENT: Negative for neck pain and neck stiffness.   Eyes: Negative for photophobia and visual disturbance.  Respiratory: Negative for cough and shortness of breath.   Cardiovascular: Negative for chest pain and palpitations.  Gastrointestinal: Positive for nausea. Negative for vomiting, diarrhea and abdominal distention.  Genitourinary: Negative for dysuria, urgency, decreased urine volume and difficulty  urinating.  Musculoskeletal: Negative for myalgias and arthralgias.  Skin: Negative for color change and rash.  Neurological: Positive for speech difficulty (transient slurred speech), weakness (right arm weakness) and headaches. Negative for dizziness.  Psychiatric/Behavioral: Negative for confusion and agitation.  All other systems reviewed and are negative.    Allergies  Ivp dye; Percocet; Pyridium plus; Ultram; Zomig; Codeine; Ditropan; and Hydrocodone  Home Medications   Current Outpatient Rx  Name  Route  Sig  Dispense  Refill  . ASPIRIN EC 81 MG PO TBEC   Oral   Take 81 mg by mouth daily.         Marland Kitchen CETIRIZINE HCL 10 MG PO TABS   Oral   Take 10 mg by mouth daily.         . DULOXETINE HCL 60 MG PO CPEP   Oral   Take 60 mg by mouth daily.          Marland Kitchen HYDROXYZINE PAMOATE 25 MG PO CAPS   Oral   Take 25 mg by mouth every 4 (four) hours as needed. For anxiety         . LAMOTRIGINE 150 MG PO TABS   Oral   Take 150 mg by mouth 2 (two) times daily.         Marland Kitchen LITHIUM CARBONATE 600 MG PO CAPS   Oral   Take 600 mg by mouth 2 (two) times daily with a meal.         . MELATONIN 3  MG PO TABS   Oral   Take 3 mg by mouth daily.          . SUMATRIPTAN SUCCINATE 100 MG PO TABS   Oral   Take 100 mg by mouth every 2 (two) hours as needed. For migraine         . TOPIRAMATE 50 MG PO TABS   Oral   Take 50-100 mg by mouth 2 (two) times daily. Take 50mg  in the  morning and 100mg  at night           BP 110/69  Pulse 80  Temp 98.9 F (37.2 C) (Oral)  Resp 12  SpO2 98%  LMP 01/13/2012  Physical Exam  Nursing note and vitals reviewed. Constitutional: She is oriented to person, place, and time. She appears well-developed and well-nourished.  HENT:  Head: Normocephalic and atraumatic.  Eyes: Conjunctivae normal and EOM are normal. Pupils are equal, round, and reactive to light.  Neck: Normal range of motion. Neck supple.  Cardiovascular: Normal rate, regular  rhythm and normal heart sounds.   Pulmonary/Chest: Effort normal and breath sounds normal.  Abdominal: Soft. Bowel sounds are normal.  Musculoskeletal: Normal range of motion.  Neurological: She is alert and oriented to person, place, and time. No cranial nerve deficit or sensory deficit. GCS eye subscore is 4. GCS verbal subscore is 5. GCS motor subscore is 6.       5-/5 R bicep and tricep Remainder of major muscle groups 5+/5    ED Course  Procedures (including critical care time)  Labs Reviewed  CBC WITH DIFFERENTIAL - Abnormal; Notable for the following:    RBC 3.78 (*)     Hemoglobin 11.4 (*)     HCT 33.3 (*)     All other components within normal limits  URINALYSIS, ROUTINE W REFLEX MICROSCOPIC - Abnormal; Notable for the following:    APPearance CLOUDY (*)     Hgb urine dipstick TRACE (*)     Leukocytes, UA TRACE (*)     All other components within normal limits  COMPREHENSIVE METABOLIC PANEL - Abnormal; Notable for the following:    Glucose, Bld 104 (*)     Total Bilirubin 0.2 (*)     GFR calc non Af Amer 87 (*)     All other components within normal limits  URINE MICROSCOPIC-ADD ON - Abnormal; Notable for the following:    Casts GRANULAR CAST (*)     All other components within normal limits  POCT PREGNANCY, URINE   No results found.   1. Migraine    MDM  36 yo F with history of migraines and cerebral aneurysms presents with typical complex migraine for patient. Afebrile, vital signs stable. Mild weakness in right arm and transient slurred speech which patient states is typical or previous complex migraines. Doubt CVA. Doubt SAH as no neck pain, headache is typical of previous migraines. No indication for head ct. No other symptoms to warrant further workup at this time. Treated with migraine cocktail with resolution of pain. She was felt to be stable for outpatient management as her symptoms have improved, she is able to tolerate PO, has outpatient f/u. Return  precautions to include worsening pain, fever, syncope were given. Patient in agreement with plan and voiced understanding.   Reviewed previous medical records and utilized in MDM  Discussed case with Dr. Jeraldine Loots.   Clinical Impression 1. Complex migraine          Margie Billet, MD 07/16/12 [phone]

## 2012-07-16 NOTE — ED Provider Notes (Signed)
  I performed a history and physical examination of Katherine [name] and discussed her management with Dr. Freida Busman.  I agree with the history, physical, assessment, and plan of care, with the following exceptions: None  On my initial exam the patient was uncomfortable, though in no distress. Following meds, she was significantly improved.  She requested D/C and this was accommodated.  Katherine Jarvis, MD 07/16/12 0020

## 2012-10-08 ENCOUNTER — Telehealth (HOSPITAL_COMMUNITY): Payer: Self-pay | Admitting: Interventional Radiology

## 2012-10-08 ENCOUNTER — Other Ambulatory Visit (HOSPITAL_COMMUNITY): Payer: Self-pay | Admitting: Interventional Radiology

## 2012-10-08 DIAGNOSIS — I729 Aneurysm of unspecified site: Secondary | ICD-10-CM

## 2012-10-20 ENCOUNTER — Other Ambulatory Visit (HOSPITAL_COMMUNITY): Payer: Self-pay | Admitting: Interventional Radiology

## 2012-10-20 DIAGNOSIS — I729 Aneurysm of unspecified site: Secondary | ICD-10-CM

## 2012-10-25 ENCOUNTER — Ambulatory Visit (HOSPITAL_COMMUNITY)
Admission: RE | Admit: 2012-10-25 | Discharge: 2012-10-25 | Disposition: A | Discharge status: Home / Self Care | Payer: BC Managed Care – PPO | Source: Ambulatory Visit | Attending: Interventional Radiology | Admitting: Interventional Radiology

## 2012-10-25 ENCOUNTER — Ambulatory Visit (HOSPITAL_COMMUNITY): Admission: RE | Admit: 2012-10-25 | Payer: BC Managed Care – PPO | Source: Ambulatory Visit

## 2012-10-25 DIAGNOSIS — I729 Aneurysm of unspecified site: Secondary | ICD-10-CM

## 2012-10-25 DIAGNOSIS — I671 Cerebral aneurysm, nonruptured: Secondary | ICD-10-CM | POA: Insufficient documentation

## 2012-10-25 MED ORDER — GADOBENATE DIMEGLUMINE 529 MG/ML IV SOLN
18.0000 mL | Freq: Once | INTRAVENOUS | Status: AC | PRN
  Administered 2012-10-25: 18 mL via INTRAVENOUS

## 2012-11-30 ENCOUNTER — Emergency Department (HOSPITAL_COMMUNITY): Payer: BC Managed Care – PPO

## 2012-11-30 ENCOUNTER — Encounter (HOSPITAL_COMMUNITY): Payer: Self-pay | Admitting: Emergency Medicine

## 2012-11-30 ENCOUNTER — Emergency Department (HOSPITAL_COMMUNITY)
Admission: EM | Admit: 2012-11-30 | Discharge: 2012-12-01 | Disposition: A | Discharge status: Home / Self Care | Payer: BC Managed Care – PPO | Attending: Emergency Medicine | Admitting: Emergency Medicine

## 2012-11-30 DIAGNOSIS — R42 Dizziness and giddiness: Secondary | ICD-10-CM | POA: Insufficient documentation

## 2012-11-30 DIAGNOSIS — R0602 Shortness of breath: Secondary | ICD-10-CM

## 2012-11-30 DIAGNOSIS — Z8679 Personal history of other diseases of the circulatory system: Secondary | ICD-10-CM | POA: Insufficient documentation

## 2012-11-30 DIAGNOSIS — R209 Unspecified disturbances of skin sensation: Secondary | ICD-10-CM | POA: Insufficient documentation

## 2012-11-30 DIAGNOSIS — Z79899 Other long term (current) drug therapy: Secondary | ICD-10-CM | POA: Insufficient documentation

## 2012-11-30 DIAGNOSIS — Z3202 Encounter for pregnancy test, result negative: Secondary | ICD-10-CM | POA: Insufficient documentation

## 2012-11-30 DIAGNOSIS — Z7982 Long term (current) use of aspirin: Secondary | ICD-10-CM | POA: Insufficient documentation

## 2012-11-30 DIAGNOSIS — R6889 Other general symptoms and signs: Secondary | ICD-10-CM | POA: Insufficient documentation

## 2012-11-30 DIAGNOSIS — G43909 Migraine, unspecified, not intractable, without status migrainosus: Secondary | ICD-10-CM | POA: Insufficient documentation

## 2012-11-30 LAB — CBC WITH DIFFERENTIAL/PLATELET
Basophils Absolute: 0 10*3/uL (ref 0.0–0.1)
Basophils Relative: 1 % (ref 0–1)
Eosinophils Absolute: 0 10*3/uL (ref 0.0–0.7)
Eosinophils Relative: 0 % (ref 0–5)
HCT: 37.2 % (ref 36.0–46.0)
Hemoglobin: 13.2 g/dL (ref 12.0–15.0)
Lymphocytes Relative: 43 % (ref 12–46)
Lymphs Abs: 2.9 10*3/uL (ref 0.7–4.0)
MCH: 29.3 pg (ref 26.0–34.0)
MCHC: 35.5 g/dL (ref 30.0–36.0)
MCV: 82.7 fL (ref 78.0–100.0)
Monocytes Absolute: 0.3 10*3/uL (ref 0.1–1.0)
Monocytes Relative: 5 % (ref 3–12)
Neutro Abs: 3.5 10*3/uL (ref 1.7–7.7)
Neutrophils Relative %: 52 % (ref 43–77)
Platelets: 260 10*3/uL (ref 150–400)
RBC: 4.5 MIL/uL (ref 3.87–5.11)
RDW: 14.1 % (ref 11.5–15.5)
WBC: 6.7 10*3/uL (ref 4.0–10.5)

## 2012-11-30 LAB — URINALYSIS, ROUTINE W REFLEX MICROSCOPIC
Bilirubin Urine: NEGATIVE
Glucose, UA: NEGATIVE mg/dL
Ketones, ur: NEGATIVE mg/dL
Leukocytes, UA: NEGATIVE
Nitrite: NEGATIVE
Protein, ur: NEGATIVE mg/dL
Specific Gravity, Urine: 1.027 (ref 1.005–1.030)
Urobilinogen, UA: 0.2 mg/dL (ref 0.0–1.0)
pH: 7 (ref 5.0–8.0)

## 2012-11-30 LAB — COMPREHENSIVE METABOLIC PANEL
ALT: 20 U/L (ref 0–35)
AST: 22 U/L (ref 0–37)
Albumin: 4 g/dL (ref 3.5–5.2)
Alkaline Phosphatase: 62 U/L (ref 39–117)
BUN: 11 mg/dL (ref 6–23)
CO2: 19 mEq/L (ref 19–32)
Calcium: 9.2 mg/dL (ref 8.4–10.5)
Chloride: 108 mEq/L (ref 96–112)
Creatinine, Ser: 0.99 mg/dL (ref 0.50–1.10)
GFR calc Af Amer: 85 mL/min — ABNORMAL LOW (ref >90)
GFR calc non Af Amer: 73 mL/min — ABNORMAL LOW (ref >90)
Glucose, Bld: 99 mg/dL (ref 70–99)
Potassium: 3.6 mEq/L (ref 3.5–5.1)
Sodium: 139 mEq/L (ref 135–145)
Total Bilirubin: 0.3 mg/dL (ref 0.3–1.2)
Total Protein: 7.3 g/dL (ref 6.0–8.3)

## 2012-11-30 LAB — URINE MICROSCOPIC-ADD ON

## 2012-11-30 LAB — D-DIMER, QUANTITATIVE (NOT AT ARMC): D-Dimer, Quant: 4.98 ug/mL-FEU — ABNORMAL HIGH (ref 0.00–0.48)

## 2012-11-30 LAB — POCT PREGNANCY, URINE: Preg Test, Ur: NEGATIVE

## 2012-11-30 LAB — POCT I-STAT TROPONIN I: Troponin i, poc: 0 ng/mL (ref 0.00–0.08)

## 2012-11-30 MED ORDER — TECHNETIUM TO 99M ALBUMIN AGGREGATED
6.0000 | Freq: Once | INTRAVENOUS | Status: AC | PRN
  Administered 2012-11-30: 6 via INTRAVENOUS

## 2012-11-30 MED ORDER — ONDANSETRON 4 MG PO TBDP
8.0000 mg | ORAL_TABLET | Freq: Once | ORAL | Status: AC
  Administered 2012-11-30: 8 mg via ORAL
  Filled 2012-11-30: qty 2

## 2012-11-30 MED ORDER — TECHNETIUM TC 99M DIETHYLENETRIAME-PENTAACETIC ACID: 40.0000 | Freq: Once | INTRAVENOUS | Status: AC | PRN

## 2012-11-30 NOTE — ED Notes (Signed)
Onset SOB and light-headedness on exertion 7 days ago. Seen by MD today, blood draw done, elevated d-dimer, sent to ED for evaluation.

## 2012-11-30 NOTE — ED Provider Notes (Signed)
History     CSN: [id number]  Arrival date & time 11/30/12  1806   First MD Initiated Contact with Patient 11/30/12 1908      Chief Complaint  Patient presents with  . Shortness of Breath  . Abnormal Lab    (Consider location/radiation/quality/duration/timing/severity/associated sxs/prior treatment) HPI Comments: Patient is a 37 year old woman who presents today after being sent in from her PCP. She has been having SOB for the past week associated with fatigue. She has been moving as blamed her SOB on being out of shape. She then sought care because she was still out of breath for the past few days after finishing the move. It was found she had a high D-dimer at her PCP's office. She is not in any pain. No pleuritic pain. She has not had any recent trips or surgeries. No history of DVT or PE. No swelling in either of her legs. She is on Leupron injections for her birth control. She is not a smoker. No nausea, vomiting, abdominal pain, fevers, or weakness. She has chronic numbness that is migraine related. She is currently experiencing some numbness in her toes.    Patient is a 37 y.o. female presenting with shortness of breath. The history is provided by the patient. No language interpreter was used.  Shortness of Breath Severity:  Mild Onset quality:  Gradual Duration:  7 days Timing:  Intermittent Progression:  Unchanged Chronicity:  New Context: activity   Relieved by:  None tried Worsened by:  Activity Ineffective treatments:  None tried Associated symptoms: no abdominal pain, no chest pain, no fever and no vomiting   Risk factors: no hx of PE/DVT and no obesity     Past Medical History  Diagnosis Date  . Migraine   . Cerebral aneurysm     Past Surgical History  Procedure Laterality Date  . Brain surgery    . Cholecystectomy    . Appendectomy      History reviewed. No pertinent family history.  History  Substance Use Topics  . Smoking status: Never Smoker   .  Smokeless tobacco: Not on file  . Alcohol Use: Yes     Comment: occasional    OB History   Grav Para Term Preterm Abortions TAB SAB Ect Mult Living                  Review of Systems  Constitutional: Negative for fever and chills.  Respiratory: Positive for shortness of breath.   Cardiovascular: Negative for chest pain.  Gastrointestinal: Negative for nausea, vomiting, abdominal pain, diarrhea and constipation.  Neurological: Positive for light-headedness and numbness. Negative for weakness.  All other systems reviewed and are negative.    Allergies  Ultram; Ivp dye; Percocet; Pyridium plus; Zomig; Codeine; Ditropan; and Hydrocodone  Home Medications   Current Outpatient Rx  Name  Route  Sig  Dispense  Refill  . aspirin EC 81 MG tablet   Oral   Take 81 mg by mouth daily.         . cetirizine (ZYRTEC) 10 MG tablet   Oral   Take 10 mg by mouth at bedtime.          . lamoTRIgine (LAMICTAL) 150 MG tablet   Oral   Take 150 mg by mouth 2 (two) times daily.         Marland Kitchen Leuprolide Acetate (LUPRON IJ)   Injection   Inject 1 each as directed every 30 (thirty) days.          Marland Kitchen  Melatonin 3 MG TABS   Oral   Take 3 mg by mouth at bedtime.          . norethindrone (AYGESTIN) 5 MG tablet   Oral   Take 5 mg by mouth daily.         Marland Kitchen omeprazole (PRILOSEC) 20 MG capsule   Oral   Take 20 mg by mouth at bedtime.         Marland Kitchen OVER THE COUNTER MEDICATION   Apply externally   Apply 1 application topically 4 (four) times a week. Essential oils         . topiramate (TOPAMAX) 50 MG tablet   Oral   Take 100 mg by mouth 2 (two) times daily.          Marland Kitchen zonisamide (ZONEGRAN) 50 MG capsule   Oral   Take 100 mg by mouth at bedtime.           BP 113/64  Pulse 75  Temp(Src) 98.2 F (36.8 C) (Oral)  Resp 18  Ht 5\' 3"  (1.6 m)  Wt 198 lb (89.812 kg)  BMI 35.08 kg/m2  SpO2 100%  Physical Exam  Nursing note and vitals reviewed. Constitutional: She is oriented  to person, place, and time. Vital signs are normal. She appears well-developed and well-nourished. No distress.  HENT:  Head: Normocephalic and atraumatic.  Right Ear: External ear normal.  Left Ear: External ear normal.  Nose: Nose normal.  Mouth/Throat: Oropharynx is clear and moist.  Eyes: Conjunctivae are normal.  Neck: Normal range of motion.  Cardiovascular: Normal rate, regular rhythm and normal heart sounds.   Pulmonary/Chest: Effort normal and breath sounds normal. No stridor. No respiratory distress. She has no wheezes. She has no rales.  Abdominal: Soft. She exhibits no distension.  Musculoskeletal: Normal range of motion.  Neurological: She is alert and oriented to person, place, and time. She has normal strength.  Skin: Skin is warm and dry. She is not diaphoretic. No erythema.  Psychiatric: She has a normal mood and affect. Her behavior is normal.    ED Course  Procedures (including critical care time)  Labs Reviewed  COMPREHENSIVE METABOLIC PANEL - Abnormal; Notable for the following:    GFR calc non Af Amer 73 (*)    GFR calc Af Amer 85 (*)    All other components within normal limits  D-DIMER, QUANTITATIVE - Abnormal; Notable for the following:    D-Dimer, Quant 4.98 (*)    All other components within normal limits  URINALYSIS, ROUTINE W REFLEX MICROSCOPIC - Abnormal; Notable for the following:    APPearance TURBID (*)    Hgb urine dipstick LARGE (*)    All other components within normal limits  CBC WITH DIFFERENTIAL  URINE MICROSCOPIC-ADD ON  POCT I-STAT TROPONIN I  POCT PREGNANCY, URINE   Nm Pulmonary Perf And Vent  11/30/2012  *RADIOLOGY REPORT*  Clinical Data:  Shortness of breath.  NUCLEAR MEDICINE VENTILATION - PERFUSION LUNG SCAN  Technique:  Wash-in, equilibrium, and wash-out phase ventilation images were obtained using Xe-133 gas.  Perfusion images were obtained in multiple projections after intravenous injection of Tc- 49m MAA.  Radiopharmaceuticals:   40 mCi Tc-26m DTPA inhalant and 6 mCi Tc-58m MAA.  Comparison:  Chest radiograph performed earlier today at 12:17 p.m.  Findings: Ventilation images demonstrate slight heterogeneity of uptake, without significant focal ventilation defect.  A small amount of accumulation of activity is noted within the stomach.  Perfusion images demonstrate no focal perfusion  defects to suggest pulmonary embolus.  There is essentially normal perfusion to both lungs.  No significant focal abnormalities are seen on corresponding chest radiograph.  IMPRESSION: Normal V/Q scan; no focal perfusion defects identified.   Original Report Authenticated By: Tonia Ghent, M.D.      1. Shortness of breath       MDM  Patient presented with a week of shortness of breath and fatigue. She was sent in from her PCP because she had an elevated d-dimer. V/Q scan done due to contrast dye allergy. V/Q scan normal. Follow up with PCP for exploration of shortness of breath and fatigue. Low concern for PE. O2 sat remained 97% or greater on RA through entire ED course. Strict return precautions given. Case discussed with attending. He agrees with plan. Vital signs stable for discharge. Patient / Family / Caregiver informed of clinical course, understand medical decision-making process, and agree with plan.        Mora Bellman, PA-C 12/01/12 0021

## 2012-11-30 NOTE — ED Notes (Signed)
Respiratory contacted for pulmonary perf and vent

## 2012-12-01 ENCOUNTER — Other Ambulatory Visit: Payer: Self-pay | Admitting: Family Medicine

## 2012-12-01 DIAGNOSIS — R0602 Shortness of breath: Secondary | ICD-10-CM

## 2012-12-02 ENCOUNTER — Ambulatory Visit
Admission: RE | Admit: 2012-12-02 | Discharge: 2012-12-02 | Disposition: A | Discharge status: Home / Self Care | Payer: BC Managed Care – PPO | Source: Ambulatory Visit | Attending: Family Medicine | Admitting: Family Medicine

## 2012-12-02 DIAGNOSIS — R0602 Shortness of breath: Secondary | ICD-10-CM

## 2012-12-07 NOTE — ED Provider Notes (Signed)
Medical screening examination/treatment/procedure(s) were performed by non-physician practitioner and as supervising physician I was immediately available for consultation/collaboration.  Raeford Razor, MD 12/07/12 1031

## 2013-04-22 ENCOUNTER — Other Ambulatory Visit (HOSPITAL_COMMUNITY): Payer: Self-pay | Admitting: Obstetrics and Gynecology

## 2013-04-22 ENCOUNTER — Ambulatory Visit (HOSPITAL_COMMUNITY): Admission: RE | Admit: 2013-04-22 | Payer: BC Managed Care – PPO | Source: Ambulatory Visit

## 2013-04-22 DIAGNOSIS — R52 Pain, unspecified: Secondary | ICD-10-CM

## 2013-11-30 ENCOUNTER — Other Ambulatory Visit (HOSPITAL_COMMUNITY): Payer: Self-pay | Admitting: Interventional Radiology

## 2013-12-02 ENCOUNTER — Other Ambulatory Visit (HOSPITAL_COMMUNITY): Payer: Self-pay | Admitting: Interventional Radiology

## 2013-12-02 DIAGNOSIS — I729 Aneurysm of unspecified site: Secondary | ICD-10-CM

## 2013-12-08 ENCOUNTER — Telehealth (HOSPITAL_COMMUNITY): Payer: Self-pay | Admitting: Interventional Radiology

## 2013-12-08 NOTE — Telephone Encounter (Signed)
Called in pre-meds for pt for contrast ax for Prednisone 50mg  x3 tablets, take 1 tablet 13 hours prior to procedure, take another tablet 7 hours prior to procedure, and take the third tablet along with a Benadryl 50mg  tablet 1 hour prior to the procedure. Spoke to pharmacist directly. JM

## 2013-12-21 ENCOUNTER — Ambulatory Visit (HOSPITAL_COMMUNITY)
Admission: RE | Admit: 2013-12-21 | Discharge: 2013-12-21 | Disposition: A | Discharge status: Home / Self Care | Payer: BC Managed Care – PPO | Source: Ambulatory Visit | Attending: Interventional Radiology | Admitting: Interventional Radiology

## 2013-12-21 DIAGNOSIS — I671 Cerebral aneurysm, nonruptured: Secondary | ICD-10-CM | POA: Insufficient documentation

## 2013-12-21 DIAGNOSIS — I729 Aneurysm of unspecified site: Secondary | ICD-10-CM

## 2013-12-21 MED ORDER — GADOBENATE DIMEGLUMINE 529 MG/ML IV SOLN
20.0000 mL | Freq: Once | INTRAVENOUS | Status: AC | PRN
  Administered 2013-12-21: 19 mL via INTRAVENOUS

## 2014-01-05 ENCOUNTER — Emergency Department (HOSPITAL_BASED_OUTPATIENT_CLINIC_OR_DEPARTMENT_OTHER)
Admission: EM | Admit: 2014-01-05 | Discharge: 2014-01-06 | Disposition: A | Discharge status: Home / Self Care | Payer: BC Managed Care – PPO | Attending: Emergency Medicine | Admitting: Emergency Medicine

## 2014-01-05 ENCOUNTER — Encounter (HOSPITAL_BASED_OUTPATIENT_CLINIC_OR_DEPARTMENT_OTHER): Payer: Self-pay | Admitting: Emergency Medicine

## 2014-01-05 DIAGNOSIS — R109 Unspecified abdominal pain: Secondary | ICD-10-CM

## 2014-01-05 DIAGNOSIS — Z3202 Encounter for pregnancy test, result negative: Secondary | ICD-10-CM | POA: Insufficient documentation

## 2014-01-05 DIAGNOSIS — Z7982 Long term (current) use of aspirin: Secondary | ICD-10-CM | POA: Insufficient documentation

## 2014-01-05 DIAGNOSIS — R300 Dysuria: Secondary | ICD-10-CM | POA: Insufficient documentation

## 2014-01-05 DIAGNOSIS — Z9089 Acquired absence of other organs: Secondary | ICD-10-CM | POA: Insufficient documentation

## 2014-01-05 DIAGNOSIS — Z79899 Other long term (current) drug therapy: Secondary | ICD-10-CM | POA: Insufficient documentation

## 2014-01-05 DIAGNOSIS — Z8679 Personal history of other diseases of the circulatory system: Secondary | ICD-10-CM | POA: Insufficient documentation

## 2014-01-05 DIAGNOSIS — R1032 Left lower quadrant pain: Secondary | ICD-10-CM | POA: Insufficient documentation

## 2014-01-05 DIAGNOSIS — G43909 Migraine, unspecified, not intractable, without status migrainosus: Secondary | ICD-10-CM | POA: Insufficient documentation

## 2014-01-05 LAB — URINE MICROSCOPIC-ADD ON

## 2014-01-05 LAB — URINALYSIS, ROUTINE W REFLEX MICROSCOPIC
BILIRUBIN URINE: NEGATIVE
GLUCOSE, UA: NEGATIVE mg/dL
Ketones, ur: NEGATIVE mg/dL
Leukocytes, UA: NEGATIVE
Nitrite: NEGATIVE
PROTEIN: NEGATIVE mg/dL
SPECIFIC GRAVITY, URINE: 1.024 (ref 1.005–1.030)
UROBILINOGEN UA: 1 mg/dL (ref 0.0–1.0)
pH: 7 (ref 5.0–8.0)

## 2014-01-05 LAB — PREGNANCY, URINE: PREG TEST UR: NEGATIVE

## 2014-01-05 MED ORDER — KETOROLAC TROMETHAMINE 30 MG/ML IJ SOLN
30.0000 mg | Freq: Once | INTRAMUSCULAR | Status: AC
  Administered 2014-01-06: 30 mg via INTRAVENOUS
  Filled 2014-01-05: qty 1

## 2014-01-05 MED ORDER — FENTANYL CITRATE 0.05 MG/ML IJ SOLN
100.0000 ug | Freq: Once | INTRAMUSCULAR | Status: AC
  Administered 2014-01-06: 100 ug via INTRAVENOUS
  Filled 2014-01-05: qty 2

## 2014-01-05 MED ORDER — ONDANSETRON HCL 4 MG/2ML IJ SOLN
4.0000 mg | Freq: Once | INTRAMUSCULAR | Status: AC
  Administered 2014-01-06: 4 mg via INTRAVENOUS
  Filled 2014-01-05: qty 2

## 2014-01-05 MED ORDER — SODIUM CHLORIDE 0.9 % IV SOLN
INTRAVENOUS | Status: DC
  Administered 2014-01-06: 1000 mL via INTRAVENOUS

## 2014-01-05 NOTE — ED Notes (Signed)
LLQ pain x 3 weeks-back pain started tonight-urinary freq, dysuria with "feel like i'm forcing it to come out"

## 2014-01-05 NOTE — ED Provider Notes (Addendum)
CSN: [id number]     Arrival date & time 01/05/14  2130 History  This chart was scribed for Wynetta Fines, MD by Delphia Grates, ED Scribe. This patient was seen in room MH07/MH07 and the patient's care was started at 11:48 PM.     Chief Complaint  Patient presents with  . Abdominal Pain     The history is provided by the patient. No language interpreter was used.   HPI Comments: Katherine [name] is a 38 y.o. female who presents to the Emergency Department complaining of LLQ abdominal pain onset 3 weeks ago acutely worsened this evening and has started radiating to the left lower back. Patient describes the pain as "really really bad". She states she feels as if she has to "force herself to urinate". There is associated nausea and one episode of vomiting a week ago that may or may not have been related. Patient also states she noted blood on toilet paper when she used the restroom while waiting to be seen. She denies this is related to her menstrual period and states she has not had one in 3 months. She states the pain improves when standing, and not worsened by movement. Patient reports taking Advil and Tylenol with no relief. Patient has past surgical history of cholecystectomy and appendectomy.   Past Medical History  Diagnosis Date  . Migraine   . Cerebral aneurysm    Past Surgical History  Procedure Laterality Date  . Brain surgery    . Cholecystectomy    . Appendectomy     No family history on file. History  Substance Use Topics  . Smoking status: Never Smoker   . Smokeless tobacco: Not on file  . Alcohol Use: No   OB History   Grav Para Term Preterm Abortions TAB SAB Ect Mult Living                 Review of Systems  Gastrointestinal: Positive for nausea and vomiting.  Genitourinary: Positive for dysuria and flank pain.  All other systems reviewed and are negative.   Allergies  Ultram; Ivp dye; Percocet; Pyridium plus; Zomig; Codeine; Ditropan; and  Hydrocodone  Home Medications   Prior to Admission medications   Medication Sig Start Date End Date Taking? Authorizing Provider  Fexofenadine HCl (ALLEGRA PO) Take by mouth.   Yes Historical Provider, MD  QUEtiapine Fumarate (SEROQUEL PO) Take by mouth.   Yes Historical Provider, MD  aspirin EC 81 MG tablet Take 81 mg by mouth daily.    Historical Provider, MD  cetirizine (ZYRTEC) 10 MG tablet Take 10 mg by mouth at bedtime.     Historical Provider, MD  lamoTRIgine (LAMICTAL) 150 MG tablet Take 150 mg by mouth 2 (two) times daily.    Historical Provider, MD  Leuprolide Acetate (LUPRON IJ) Inject 1 each as directed every 30 (thirty) days.     Historical Provider, MD  Melatonin 3 MG TABS Take 3 mg by mouth at bedtime.     Historical Provider, MD  norethindrone (AYGESTIN) 5 MG tablet Take 5 mg by mouth daily.    Historical Provider, MD  omeprazole (PRILOSEC) 20 MG capsule Take 20 mg by mouth at bedtime.    Historical Provider, MD  OVER THE COUNTER MEDICATION Apply 1 application topically 4 (four) times a week. Essential oils    Historical Provider, MD  topiramate (TOPAMAX) 50 MG tablet Take 100 mg by mouth 2 (two) times daily.     Historical Provider, MD  zonisamide (ZONEGRAN) 50 MG capsule Take 100 mg by mouth at bedtime.    Historical Provider, MD   Triage Vitals: BP 118/76  Pulse 73  Temp(Src) 98.8 F (37.1 C) (Oral)  Resp 16  Ht 5\' 3"  (1.6 m)  Wt 176 lb (79.833 kg)  BMI 31.18 kg/m2  SpO2 100%  Physical Exam General: Well-developed, well-nourished female in no acute distress; appearance consistent with age of record HENT: normocephalic; atraumatic Eyes: pupils equal, round and reactive to light; extraocular muscles intact Neck: supple Heart: regular rate and rhythm; no murmurs, rubs or gallops Lungs: clear to auscultation bilaterally Abdomen: soft; nondistended; LUQ tenderness; no masses or hepatosplenomegaly; bowel sounds present GU: mild left CVA tenderness Extremities: No  deformity; full range of motion; pulses normal Neurologic: Awake, alert and oriented; motor function intact in all extremities and symmetric; no facial droop Skin: Warm and dry Psychiatric: Normal mood and affect  ED Course  Procedures (including critical care time)    MDM   Nursing notes and vitals signs, including pulse oximetry, reviewed.  Summary of this visit's results, reviewed by myself:  Labs:  Results for orders placed during the hospital encounter of 01/05/14 (from the past 24 hour(s))  URINALYSIS, ROUTINE W REFLEX MICROSCOPIC     Status: Abnormal   Collection Time    01/05/14  9:50 PM      Result Value Ref Range   Color, Urine YELLOW  YELLOW   APPearance TURBID (*) CLEAR   Specific Gravity, Urine 1.024  1.005 - 1.030   pH 7.0  5.0 - 8.0   Glucose, UA NEGATIVE  NEGATIVE mg/dL   Hgb urine dipstick MODERATE (*) NEGATIVE   Bilirubin Urine NEGATIVE  NEGATIVE   Ketones, ur NEGATIVE  NEGATIVE mg/dL   Protein, ur NEGATIVE  NEGATIVE mg/dL   Urobilinogen, UA 1.0  0.0 - 1.0 mg/dL   Nitrite NEGATIVE  NEGATIVE   Leukocytes, UA NEGATIVE  NEGATIVE  PREGNANCY, URINE     Status: None   Collection Time    01/05/14  9:50 PM      Result Value Ref Range   Preg Test, Ur NEGATIVE  NEGATIVE  URINE MICROSCOPIC-ADD ON     Status: Abnormal   Collection Time    01/05/14  9:50 PM      Result Value Ref Range   Squamous Epithelial / LPF FEW (*) RARE   RBC / HPF 7-10  <3 RBC/hpf   Bacteria, UA RARE  RARE   Urine-Other AMORPHOUS URATES/PHOSPHATES      Imaging Studies: Ct Abdomen Pelvis Wo Contrast  01/06/2014   CLINICAL DATA:  Left flank pain  EXAM: CT ABDOMEN AND PELVIS WITHOUT CONTRAST  TECHNIQUE: Multidetector CT imaging of the abdomen and pelvis was performed following the standard protocol without IV contrast.  COMPARISON:  None.  FINDINGS: Lung bases are free of acute infiltrate or sizable effusion. The gallbladder has been surgically removed. The spleen, liver, adrenal glands and  pancreas are all normal in their CT appearance. The kidneys are well visualized bilaterally and reveal no renal calculi or obstructive changes. The bladder is partially distended. The appendix is been surgically removed. Pelvic structures are within normal limits. The bony structures are unremarkable.  IMPRESSION: No acute abnormality noted.   Electronically Signed   By: Inez Catalina M.D.   On: 01/06/2014 00:37   2:21 AM The patient's pain is improved after IV Toradol and fentanyl. They all caused itching which was relieved by Benadryl. The patient is CT  did not reveal the cause of her pain. There was no evidence of nephrolithiasis or recently passed stone. Her microscopic hematuria suggestive of a urinary tract etiology. As she does not tolerate narcotic pain medications she is amenable to being treated with high-dose naproxen sodium. She was advised that she may need urologic followup if symptoms persist.  I personally performed the services described in this documentation, which was scribed in my presence. The recorded information has been reviewed and is accurate.   Wynetta Fines, MD 01/06/14 [place], MD 01/06/14 [phone]

## 2014-01-05 NOTE — ED Notes (Signed)
Minor abd pain x 3 weeks  With increased left lower abd pain this pm radiating to back

## 2014-01-06 ENCOUNTER — Emergency Department (HOSPITAL_BASED_OUTPATIENT_CLINIC_OR_DEPARTMENT_OTHER): Payer: BC Managed Care – PPO

## 2014-01-06 MED ORDER — FENTANYL CITRATE 0.05 MG/ML IJ SOLN
100.0000 ug | Freq: Once | INTRAMUSCULAR | Status: AC
  Administered 2014-01-06: 100 ug via INTRAVENOUS
  Filled 2014-01-06: qty 2

## 2014-01-06 MED ORDER — NAPROXEN SODIUM 550 MG PO TABS: ORAL_TABLET | ORAL | Status: DC

## 2014-01-06 MED ORDER — DIPHENHYDRAMINE HCL 50 MG/ML IJ SOLN
25.0000 mg | Freq: Once | INTRAMUSCULAR | Status: AC
  Administered 2014-01-06: 25 mg via INTRAVENOUS
  Filled 2014-01-06: qty 1

## 2014-01-09 ENCOUNTER — Other Ambulatory Visit (HOSPITAL_COMMUNITY): Payer: Self-pay | Admitting: Obstetrics and Gynecology

## 2014-01-09 ENCOUNTER — Ambulatory Visit (HOSPITAL_COMMUNITY)
Admission: RE | Admit: 2014-01-09 | Discharge: 2014-01-09 | Disposition: A | Discharge status: Home / Self Care | Payer: BC Managed Care – PPO | Source: Ambulatory Visit | Attending: Obstetrics and Gynecology | Admitting: Obstetrics and Gynecology

## 2014-01-09 DIAGNOSIS — R319 Hematuria, unspecified: Secondary | ICD-10-CM | POA: Insufficient documentation

## 2014-01-09 DIAGNOSIS — R102 Pelvic and perineal pain: Secondary | ICD-10-CM

## 2014-01-09 DIAGNOSIS — N949 Unspecified condition associated with female genital organs and menstrual cycle: Secondary | ICD-10-CM | POA: Insufficient documentation

## 2014-01-09 DIAGNOSIS — R300 Dysuria: Secondary | ICD-10-CM

## 2014-01-09 DIAGNOSIS — R109 Unspecified abdominal pain: Secondary | ICD-10-CM | POA: Insufficient documentation

## 2014-01-09 DIAGNOSIS — N72 Inflammatory disease of cervix uteri: Secondary | ICD-10-CM | POA: Insufficient documentation

## 2014-01-09 DIAGNOSIS — N83209 Unspecified ovarian cyst, unspecified side: Secondary | ICD-10-CM | POA: Insufficient documentation

## 2014-01-10 ENCOUNTER — Other Ambulatory Visit (HOSPITAL_COMMUNITY): Payer: BC Managed Care – PPO

## 2014-01-11 ENCOUNTER — Ambulatory Visit (HOSPITAL_COMMUNITY): Payer: BC Managed Care – PPO

## 2014-02-22 ENCOUNTER — Encounter: Payer: Self-pay | Admitting: *Deleted

## 2014-03-02 ENCOUNTER — Emergency Department (HOSPITAL_BASED_OUTPATIENT_CLINIC_OR_DEPARTMENT_OTHER)
Admission: EM | Admit: 2014-03-02 | Discharge: 2014-03-02 | Disposition: A | Discharge status: Home / Self Care | Payer: BC Managed Care – PPO | Attending: Emergency Medicine | Admitting: Emergency Medicine

## 2014-03-02 ENCOUNTER — Encounter (HOSPITAL_BASED_OUTPATIENT_CLINIC_OR_DEPARTMENT_OTHER): Payer: Self-pay | Admitting: Emergency Medicine

## 2014-03-02 ENCOUNTER — Emergency Department (HOSPITAL_BASED_OUTPATIENT_CLINIC_OR_DEPARTMENT_OTHER): Payer: BC Managed Care – PPO

## 2014-03-02 DIAGNOSIS — R55 Syncope and collapse: Secondary | ICD-10-CM | POA: Insufficient documentation

## 2014-03-02 DIAGNOSIS — F411 Generalized anxiety disorder: Secondary | ICD-10-CM | POA: Insufficient documentation

## 2014-03-02 DIAGNOSIS — Z79899 Other long term (current) drug therapy: Secondary | ICD-10-CM | POA: Insufficient documentation

## 2014-03-02 DIAGNOSIS — Z7982 Long term (current) use of aspirin: Secondary | ICD-10-CM | POA: Insufficient documentation

## 2014-03-02 DIAGNOSIS — G43009 Migraine without aura, not intractable, without status migrainosus: Secondary | ICD-10-CM | POA: Insufficient documentation

## 2014-03-02 DIAGNOSIS — R42 Dizziness and giddiness: Secondary | ICD-10-CM | POA: Insufficient documentation

## 2014-03-02 DIAGNOSIS — R112 Nausea with vomiting, unspecified: Secondary | ICD-10-CM | POA: Insufficient documentation

## 2014-03-02 DIAGNOSIS — R61 Generalized hyperhidrosis: Secondary | ICD-10-CM | POA: Insufficient documentation

## 2014-03-02 MED ORDER — DEXAMETHASONE SODIUM PHOSPHATE 10 MG/ML IJ SOLN
10.0000 mg | Freq: Once | INTRAMUSCULAR | Status: AC
  Administered 2014-03-02: 10 mg via INTRAVENOUS
  Filled 2014-03-02: qty 1

## 2014-03-02 MED ORDER — HYDROMORPHONE HCL PF 1 MG/ML IJ SOLN
1.0000 mg | Freq: Once | INTRAMUSCULAR | Status: AC
  Administered 2014-03-02: 1 mg via INTRAVENOUS
  Filled 2014-03-02: qty 1

## 2014-03-02 MED ORDER — SODIUM CHLORIDE 0.9 % IV BOLUS (SEPSIS)
2000.0000 mL | Freq: Once | INTRAVENOUS | Status: AC
  Administered 2014-03-02: 2000 mL via INTRAVENOUS

## 2014-03-02 MED ORDER — FAMOTIDINE IN NACL 20-0.9 MG/50ML-% IV SOLN
20.0000 mg | Freq: Once | INTRAVENOUS | Status: AC
Start: 2014-03-02 — End: 2014-03-02
  Administered 2014-03-02: 20 mg via INTRAVENOUS
  Filled 2014-03-02: qty 50

## 2014-03-02 MED ORDER — SODIUM CHLORIDE 0.9 % IV BOLUS (SEPSIS)
1000.0000 mL | Freq: Once | INTRAVENOUS | Status: AC
  Administered 2014-03-02: 1000 mL via INTRAVENOUS

## 2014-03-02 MED ORDER — METOCLOPRAMIDE HCL 5 MG/ML IJ SOLN
10.0000 mg | Freq: Once | INTRAMUSCULAR | Status: AC
  Administered 2014-03-02: 10 mg via INTRAVENOUS
  Filled 2014-03-02: qty 2

## 2014-03-02 MED ORDER — DIPHENHYDRAMINE HCL 50 MG/ML IJ SOLN
50.0000 mg | Freq: Once | INTRAMUSCULAR | Status: AC
  Administered 2014-03-02: 50 mg via INTRAVENOUS
  Filled 2014-03-02: qty 1

## 2014-03-02 NOTE — ED Notes (Signed)
Pt sts HA began last night with some N/V. She sts HA has gotten progressively worse throughout the day.

## 2014-03-02 NOTE — ED Provider Notes (Signed)
CSN: [id number]     Arrival date & time 03/02/14  1744 History  This chart was scribed for Babette Relic, MD by Steva Colder, ED Scribe. The patient was seen in room MH11/MH11 at [place] PM.      Chief Complaint  Patient presents with  . Migraine    The history is provided by the patient. No language interpreter was used.   HPI Comments: Katherine [name] is a 38 y.o. female with a h/o migraine who presents to the Emergency Department complaining of a progressively worsening  migraine onset last night. She states that she has a bad HA once a month, once every several months she visits a Doctor for the HA. She states that she gets complicated HA where she gets stroke like mimics. She states that her HA is located posteriorially. She states that she has associated symptoms of nausea, vomiting, lightheaded, warmth, sweating, and tunnel vision just before syncope PTA.  She describes the pain as a sharp pressure. She states that she was brought in by the ambulance from her home. She states that she took Vicodin and Benadryl last night and Imitrex today with no relief for her symptoms.   She states that today she developed light-headedness, with tunnel vision, nausea, warm, near syncope then syncope while walking down the stairs and blacked out and fell to the bottom of the steps. She states that with this HA seemed worse after she fell and fall unwitnessed.  She states that she felt warm and nauseated, before she fell. She states that the HA was worse once she come too from fainting. She states that she fainted within the last six hours. She denies vertigo, Fever, DM, neck pain, back pain, blindness or physical loss of vision. She denies pregnancy. She states that she has a h/o aneurysm, with her last one being in April, she had it tested and it was stable. She also had no CP, SOB, palpitations. She has a history of similar syncope in the past.  She denies sudden H/A or change in speech, vision, swallow,  understanding, and no focal weak/numb/incoordination.      Past Medical History  Diagnosis Date  . Migraine   . Cerebral aneurysm   . Anxiety   . Hematuria     & probable OAB  . Aneurysm     bilateral opthalmic   Past Surgical History  Procedure Laterality Date  . Brain surgery    . Cholecystectomy    . Appendectomy     No family history on file. History  Substance Use Topics  . Smoking status: Never Smoker   . Smokeless tobacco: Not on file  . Alcohol Use: No   OB History   Grav Para Term Preterm Abortions TAB SAB Ect Mult Living                 Review of Systems  Constitutional: Positive for diaphoresis. Negative for fever.  Eyes: Negative for photophobia.  Gastrointestinal: Positive for nausea and vomiting.  Musculoskeletal: Negative for back pain and neck pain.  Neurological: Positive for syncope, light-headedness and headaches.    10 Systems reviewed and are negative for acute change except as noted in the HPI.   Allergies  Ultram; Ivp dye; Percocet; Pyridium plus; Zomig; Codeine; Ditropan; and Hydrocodone  Home Medications   Prior to Admission medications   Medication Sig Start Date End Date Taking? Authorizing Provider  clorazepate (TRANXENE) 7.5 MG tablet Take 7.5 mg by mouth 2 (two) times  daily as needed for anxiety.   Yes Historical Provider, MD  ondansetron (ZOFRAN) 4 MG tablet Take 4 mg by mouth every 6 (six) hours as needed for nausea or vomiting.   Yes Historical Provider, MD  oxyCODONE-acetaminophen (PERCOCET/ROXICET) 5-325 MG per tablet Take 1-2 tablets by mouth every 6 (six) hours as needed for severe pain.   Yes Historical Provider, MD  oxymorphone (OPANA) 10 MG tablet Take 10 mg by mouth 2 (two) times daily.   Yes Historical Provider, MD  SUMAtriptan (IMITREX) 100 MG tablet Take 100 mg by mouth every 2 (two) hours as needed for migraine or headache. May repeat in 2 hours if headache persists or recurs.   Yes Historical Provider, MD  zaleplon  (SONATA) 10 MG capsule Take 10 mg by mouth at bedtime as needed for sleep.   Yes Historical Provider, MD  aspirin EC 81 MG tablet Take 81 mg by mouth daily.    Historical Provider, MD  cetirizine (ZYRTEC) 10 MG tablet Take 10 mg by mouth at bedtime.     Historical Provider, MD  Fexofenadine HCl (ALLEGRA PO) Take by mouth.    Historical Provider, MD  lamoTRIgine (LAMICTAL) 150 MG tablet Take 150 mg by mouth 2 (two) times daily.    Historical Provider, MD  Leuprolide Acetate (LUPRON IJ) Inject 1 each as directed every 30 (thirty) days.     Historical Provider, MD  Melatonin 3 MG TABS Take 3 mg by mouth at bedtime.     Historical Provider, MD  naproxen sodium (ANAPROX DS) 550 MG tablet Take 1 tablet every 12 hours as needed for pain. Best taken with a meal. 01/06/14   Karen Chafe Molpus, MD  norethindrone (AYGESTIN) 5 MG tablet Take 5 mg by mouth daily.    Historical Provider, MD  omeprazole (PRILOSEC) 20 MG capsule Take 20 mg by mouth at bedtime.    Historical Provider, MD  OVER THE COUNTER MEDICATION Apply 1 application topically 4 (four) times a week. Essential oils    Historical Provider, MD  QUEtiapine Fumarate (SEROQUEL PO) Take by mouth.    Historical Provider, MD  topiramate (TOPAMAX) 50 MG tablet Take 100 mg by mouth 2 (two) times daily.     Historical Provider, MD  zonisamide (ZONEGRAN) 50 MG capsule Take 100 mg by mouth at bedtime.    Historical Provider, MD   BP 102/59  Pulse 68  Temp(Src) 98.6 F (37 C) (Oral)  Resp 18  Ht 5\' 3"  (1.6 m)  Wt 173 lb (78.472 kg)  BMI 30.65 kg/m2  SpO2 100%  Physical Exam  Nursing note and vitals reviewed. Constitutional:  Awake, alert, nontoxic appearance with baseline speech for patient.  HENT:  Head: Atraumatic.  Mouth/Throat: No oropharyngeal exudate.  Eyes: EOM are normal. Pupils are equal, round, and reactive to light. Right eye exhibits no discharge. Left eye exhibits no discharge.  Neck: Neck supple.  No midline C-spine or back tenderness.  Positive paracervical soft tissue.   Cardiovascular: Normal rate and regular rhythm.   No murmur heard. Pulmonary/Chest: Effort normal and breath sounds normal. No stridor. No respiratory distress. She has no wheezes. She has no rales. She exhibits no tenderness.  Abdominal: Soft. Bowel sounds are normal. She exhibits no mass. There is no tenderness. There is no rebound.  Musculoskeletal: She exhibits no tenderness.  Baseline ROM, moves extremities with no obvious new focal weakness.  Lymphadenopathy:    She has no cervical adenopathy.  Neurological:  Awake, alert, cooperative and aware  of situation; motor strength bilaterally; sensation normal to light touch bilaterally; peripheral visual fields full to confrontation; no facial asymmetry; tongue midline; major cranial nerves appear intact; no pronator drift, normal finger to nose bilaterally, baseline gait without new ataxia.  Skin: No rash noted.  Psychiatric: She has a normal mood and affect.    ED Course  Procedures (including critical care time) Patient understand and agree with initial ED impression and plan with expectations set for ED visit.  DIAGNOSTIC STUDIES: Oxygen Saturation is 100% on room air, normal by my interpretation.    COORDINATION OF CARE: 6:27 PM-Discussed treatment plan which includes Benadryl, Reglan, Decadron, Dilaudid, and CT Scan with pt at bedside and pt agreed to plan.   Labs Review Labs Reviewed - No data to display  Imaging Review Ct Head Wo Contrast  03/02/2014   CLINICAL DATA:  Headache.  Previous aneurysm treatment.  EXAM: CT HEAD WITHOUT CONTRAST  TECHNIQUE: Contiguous axial images were obtained from the base of the skull through the vertex without intravenous contrast.  COMPARISON:  Brain MRI, 12/21/2013  FINDINGS: There coil masses projecting in the regions of both supra clinoid internal carotid arteries consistent with the history of previous aneurysm treatment.  No intracranial hemorrhage.   Ventricles are normal in size and configuration. No parenchymal masses or mass effect. No areas of abnormal parenchymal attenuation. No evidence of an infarct.  No extra-axial masses or abnormal fluid collections.  Visualized sinuses and mastoid air cells are clear.  IMPRESSION: 1. No acute intracranial abnormalities. 2. 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 3. No other abnormalities.   Electronically Signed   By: Lajean Manes M.D.   On: 03/02/2014 18:55     EKG Interpretation   Date/Time:  Thursday March 02 2014 18:44:41 EDT Ventricular Rate:  62 PR Interval:  178 QRS Duration: 80 QT Interval:  426 QTC Calculation: 432 R Axis:   41 Text Interpretation:  Normal sinus rhythm Normal ECG No significant change  since last tracing Confirmed by [facility]  MD, Jenny Reichmann ([id number]) on 03/02/2014  7:14:31 PM      Pt feels improved after observation and/or treatment in ED.  Patient / Family informed of clinical course, understand medical decision-making process, and agree with plan.   MDM   Final diagnoses:  Migraine without aura and without status migrainosus, not intractable     I doubt any other EMC precluding discharge at this time including, but not necessarily limited to the following: Vtach, SAH, CVA, SBI.  I personally performed the services described in this documentation, which was scribed in my presence. The recorded information has been reviewed and is accurate.    Babette Relic, MD 03/03/14 [phone]

## 2014-03-02 NOTE — Discharge Instructions (Signed)

## 2014-12-05 ENCOUNTER — Emergency Department (HOSPITAL_COMMUNITY): Payer: BC Managed Care – PPO

## 2014-12-05 ENCOUNTER — Emergency Department (HOSPITAL_COMMUNITY)
Admission: EM | Admit: 2014-12-05 | Discharge: 2014-12-05 | Disposition: A | Discharge status: Home / Self Care | Payer: BC Managed Care – PPO | Attending: Emergency Medicine | Admitting: Emergency Medicine

## 2014-12-05 ENCOUNTER — Encounter (HOSPITAL_COMMUNITY): Payer: Self-pay

## 2014-12-05 DIAGNOSIS — Y990 Civilian activity done for income or pay: Secondary | ICD-10-CM | POA: Insufficient documentation

## 2014-12-05 DIAGNOSIS — Z7982 Long term (current) use of aspirin: Secondary | ICD-10-CM | POA: Diagnosis not present

## 2014-12-05 DIAGNOSIS — W07XXXA Fall from chair, initial encounter: Secondary | ICD-10-CM | POA: Diagnosis not present

## 2014-12-05 DIAGNOSIS — Z79899 Other long term (current) drug therapy: Secondary | ICD-10-CM | POA: Diagnosis not present

## 2014-12-05 DIAGNOSIS — F419 Anxiety disorder, unspecified: Secondary | ICD-10-CM | POA: Insufficient documentation

## 2014-12-05 DIAGNOSIS — G43019 Migraine without aura, intractable, without status migrainosus: Secondary | ICD-10-CM

## 2014-12-05 DIAGNOSIS — Z3202 Encounter for pregnancy test, result negative: Secondary | ICD-10-CM | POA: Insufficient documentation

## 2014-12-05 DIAGNOSIS — G43919 Migraine, unspecified, intractable, without status migrainosus: Secondary | ICD-10-CM | POA: Insufficient documentation

## 2014-12-05 DIAGNOSIS — Y9389 Activity, other specified: Secondary | ICD-10-CM | POA: Diagnosis not present

## 2014-12-05 DIAGNOSIS — Z8679 Personal history of other diseases of the circulatory system: Secondary | ICD-10-CM | POA: Insufficient documentation

## 2014-12-05 DIAGNOSIS — W19XXXA Unspecified fall, initial encounter: Secondary | ICD-10-CM

## 2014-12-05 DIAGNOSIS — R55 Syncope and collapse: Secondary | ICD-10-CM | POA: Diagnosis not present

## 2014-12-05 DIAGNOSIS — Y9289 Other specified places as the place of occurrence of the external cause: Secondary | ICD-10-CM | POA: Insufficient documentation

## 2014-12-05 DIAGNOSIS — S0990XA Unspecified injury of head, initial encounter: Secondary | ICD-10-CM | POA: Diagnosis not present

## 2014-12-05 DIAGNOSIS — G43909 Migraine, unspecified, not intractable, without status migrainosus: Secondary | ICD-10-CM | POA: Diagnosis present

## 2014-12-05 LAB — POC URINE PREG, ED: Preg Test, Ur: NEGATIVE

## 2014-12-05 MED ORDER — KETOROLAC TROMETHAMINE 30 MG/ML IJ SOLN
30.0000 mg | Freq: Once | INTRAMUSCULAR | Status: AC
  Administered 2014-12-05: 30 mg via INTRAVENOUS
  Filled 2014-12-05: qty 1

## 2014-12-05 MED ORDER — DIPHENHYDRAMINE HCL 50 MG/ML IJ SOLN
25.0000 mg | Freq: Once | INTRAMUSCULAR | Status: AC
  Administered 2014-12-05: 25 mg via INTRAVENOUS
  Filled 2014-12-05: qty 1

## 2014-12-05 MED ORDER — METOCLOPRAMIDE HCL 5 MG/ML IJ SOLN
10.0000 mg | Freq: Once | INTRAMUSCULAR | Status: AC
  Administered 2014-12-05: 10 mg via INTRAVENOUS
  Filled 2014-12-05: qty 2

## 2014-12-05 MED ORDER — SODIUM CHLORIDE 0.9 % IV BOLUS (SEPSIS)
1000.0000 mL | Freq: Once | INTRAVENOUS | Status: AC
  Administered 2014-12-05: 1000 mL via INTRAVENOUS

## 2014-12-05 NOTE — ED Notes (Signed)
Patient transported to CT 

## 2014-12-05 NOTE — Discharge Instructions (Signed)
Syncope °Syncope is a medical term for fainting or passing out. This means you lose consciousness and drop to the ground. People are generally unconscious for less than 5 minutes. You may have some muscle twitches for up to 15 seconds before waking up and returning to normal. Syncope occurs more often in older adults, but it can happen to anyone. While most causes of syncope are not dangerous, syncope can be a sign of a serious medical problem. It is important to seek medical care.  °CAUSES  °Syncope is caused by a sudden drop in blood flow to the brain. The specific cause is often not determined. Factors that can bring on syncope include: °· Taking medicines that lower blood pressure. °· Sudden changes in posture, such as standing up quickly. °· Taking more medicine than prescribed. °· Standing in one place for too long. °· Seizure disorders. °· Dehydration and excessive exposure to heat. °· Low blood sugar (hypoglycemia). °· Straining to have a bowel movement. °· Heart disease, irregular heartbeat, or other circulatory problems. °· Fear, emotional distress, seeing blood, or severe pain. °SYMPTOMS  °Right before fainting, you may: °· Feel dizzy or light-headed. °· Feel nauseous. °· See all white or all black in your field of vision. °· Have cold, clammy skin. °DIAGNOSIS  °Your health care provider will ask about your symptoms, perform a physical exam, and perform an electrocardiogram (ECG) to record the electrical activity of your heart. Your health care provider may also perform other heart or blood tests to determine the cause of your syncope which may include: °· Transthoracic echocardiogram (TTE). During echocardiography, sound waves are used to evaluate how blood flows through your heart. °· Transesophageal echocardiogram (TEE). °· Cardiac monitoring. This allows your health care provider to monitor your heart rate and rhythm in real time. °· Holter monitor. This is a portable device that records your  heartbeat and can help diagnose heart arrhythmias. It allows your health care provider to track your heart activity for several days, if needed. °· Stress tests by exercise or by giving medicine that makes the heart beat faster. °TREATMENT  °In most cases, no treatment is needed. Depending on the cause of your syncope, your health care provider may recommend changing or stopping some of your medicines. °HOME CARE INSTRUCTIONS °· Have someone stay with you until you feel stable. °· Do not drive, use machinery, or play sports until your health care provider says it is okay. °· Keep all follow-up appointments as directed by your health care provider. °· Lie down right away if you start feeling like you might faint. Breathe deeply and steadily. Wait until all the symptoms have passed. °· Drink enough fluids to keep your urine clear or pale yellow. °· If you are taking blood pressure or heart medicine, get up slowly and take several minutes to sit and then stand. This can reduce dizziness. °SEEK IMMEDIATE MEDICAL CARE IF:  °· You have a severe headache. °· You have unusual pain in the chest, abdomen, or back. °· You are bleeding from your mouth or rectum, or you have black or tarry stool. °· You have an irregular or very fast heartbeat. °· You have pain with breathing. °· You have repeated fainting or seizure-like jerking during an episode. °· You faint when sitting or lying down. °· You have confusion. °· You have trouble walking. °· You have severe weakness. °· You have vision problems. °If you fainted, call your local emergency services (911 in [place] Do not drive   yourself to the hospital.  MAKE SURE YOU:  Understand these instructions.  Will watch your condition.  Will get help right away if you are not doing well or get worse. Document Released: 08/11/2005 Document Revised: 08/16/2013 Document Reviewed: 10/10/2011 [facility] Patient Information 2015 [place], [place]. This information is not intended to replace  advice given to you by your health care provider. Make sure you discuss any questions you have with your health care provider. Migraine Headache A migraine headache is an intense, throbbing pain on one or both sides of your head. A migraine can last for 30 minutes to several hours. CAUSES  The exact cause of a migraine headache is not always known. However, a migraine may be caused when nerves in the brain become irritated and release chemicals that cause inflammation. This causes pain. Certain things may also trigger migraines, such as:  Alcohol.  Smoking.  Stress.  Menstruation.  Aged cheeses.  Foods or drinks that contain nitrates, glutamate, aspartame, or tyramine.  Lack of sleep.  Chocolate.  Caffeine.  Hunger.  Physical exertion.  Fatigue.  Medicines used to treat chest pain (nitroglycerine), birth control pills, estrogen, and some blood pressure medicines. SIGNS AND SYMPTOMS  Pain on one or both sides of your head.  Pulsating or throbbing pain.  Severe pain that prevents daily activities.  Pain that is aggravated by any physical activity.  Nausea, vomiting, or both.  Dizziness.  Pain with exposure to bright lights, loud noises, or activity.  General sensitivity to bright lights, loud noises, or smells. Before you get a migraine, you may get warning signs that a migraine is coming (aura). An aura may include:  Seeing flashing lights.  Seeing bright spots, halos, or zigzag lines.  Having tunnel vision or blurred vision.  Having feelings of numbness or tingling.  Having trouble talking.  Having muscle weakness. DIAGNOSIS  A migraine headache is often diagnosed based on:  Symptoms.  Physical exam.  A CT scan or MRI of your head. These imaging tests cannot diagnose migraines, but they can help rule out other causes of headaches. TREATMENT Medicines may be given for pain and nausea. Medicines can also be given to help prevent recurrent  migraines.  HOME CARE INSTRUCTIONS  Only take over-the-counter or prescription medicines for pain or discomfort as directed by your health care provider. The use of long-term narcotics is not recommended.  Lie down in a dark, quiet room when you have a migraine.  Keep a journal to find out what may trigger your migraine headaches. For example, write down:  What you eat and drink.  How much sleep you get.  Any change to your diet or medicines.  Limit alcohol consumption.  Quit smoking if you smoke.  Get 7-9 hours of sleep, or as recommended by your health care provider.  Limit stress.  Keep lights dim if bright lights bother you and make your migraines worse. SEEK IMMEDIATE MEDICAL CARE IF:   Your migraine becomes severe.  You have a fever.  You have a stiff neck.  You have vision loss.  You have muscular weakness or loss of muscle control.  You start losing your balance or have trouble walking.  You feel faint or pass out.  You have severe symptoms that are different from your first symptoms. MAKE SURE YOU:   Understand these instructions.  Will watch your condition.  Will get help right away if you are not doing well or get worse. Document Released: 08/11/2005 Document Revised: 12/26/2013 Document  Document Reviewed: 04/18/2013 °[employer]® Patient Information ©2015 [employer]. This information is not intended to replace advice given to you by your health care provider. Make sure you discuss any questions you have with your health care provider. ° °

## 2014-12-05 NOTE — ED Notes (Signed)
Karen, PA at the bedside.  

## 2014-12-05 NOTE — ED Notes (Signed)
Family at bedside. 

## 2014-12-05 NOTE — ED Notes (Signed)
Per [facility], pt woke up this morning with a migraine to the back of her head at 0600. Has some nausea but no vomiting. Sensitive to light and sounds. Got up and went to work. Passed out sitting in chair and fell from chair. C/o neck pain after falling. EMS placed c-collar.

## 2014-12-05 NOTE — ED Provider Notes (Signed)
CSN: [id number]     Arrival date & time 12/05/14  9371 History   First MD Initiated Contact with Patient 12/05/14 [phone]     Chief Complaint  Patient presents with  . Migraine     (Consider location/radiation/quality/duration/timing/severity/associated sxs/prior Treatment) Patient is a 39 y.o. female presenting with headaches. The history is provided by the patient. No language interpreter was used.  Headache Pain location:  Generalized Radiates to:  Does not radiate Severity currently:  9/10 Severity at highest:  9/10 Onset quality:  Gradual Timing:  Constant Progression:  Worsening Chronicity:  New Similar to prior headaches: no   Relieved by:  Nothing Worsened by:  Nothing Ineffective treatments:  None tried Associated symptoms: no vomiting     Past Medical History  Diagnosis Date  . Migraine   . Cerebral aneurysm   . Anxiety   . Hematuria     & probable OAB  . Aneurysm     bilateral opthalmic   Past Surgical History  Procedure Laterality Date  . Brain surgery    . Cholecystectomy    . Appendectomy     No family history on file. History  Substance Use Topics  . Smoking status: Never Smoker   . Smokeless tobacco: Not on file  . Alcohol Use: No   OB History    No data available     Review of Systems  Gastrointestinal: Negative for vomiting.  Neurological: Positive for headaches.  All other systems reviewed and are negative.     Allergies  Ultram; Ivp dye; Percocet; Pyridium plus; Zomig; Codeine; Ditropan; and Hydrocodone  Home Medications   Prior to Admission medications   Medication Sig Start Date End Date Taking? Authorizing Provider  Fexofenadine HCl (ALLEGRA PO) Take by mouth.   Yes Historical Provider, MD  lamoTRIgine (LAMICTAL) 150 MG tablet Take 150 mg by mouth 2 (two) times daily.   Yes Historical Provider, MD  Leuprolide Acetate (LUPRON IJ) Inject 1 each as directed every 30 (thirty) days.    Yes Historical Provider, MD  omeprazole  (PRILOSEC) 20 MG capsule Take 20 mg by mouth at bedtime.   Yes Historical Provider, MD  OVER THE COUNTER MEDICATION Apply 1 application topically 4 (four) times a week. Essential oils   Yes Historical Provider, MD  QUEtiapine (SEROQUEL) 400 MG tablet Take 400 mg by mouth at bedtime.   Yes Historical Provider, MD  SUMAtriptan (IMITREX) 100 MG tablet Take 100 mg by mouth every 2 (two) hours as needed for migraine or headache. May repeat in 2 hours if headache persists or recurs.   Yes Historical Provider, MD  zonisamide (ZONEGRAN) 100 MG capsule Take 200 mg by mouth at bedtime.   Yes Historical Provider, MD  aspirin EC 81 MG tablet Take 81 mg by mouth daily.    Historical Provider, MD  cetirizine (ZYRTEC) 10 MG tablet Take 10 mg by mouth at bedtime.     Historical Provider, MD  clorazepate (TRANXENE) 7.5 MG tablet Take 7.5 mg by mouth 2 (two) times daily as needed for anxiety.    Historical Provider, MD  Melatonin 3 MG TABS Take 3 mg by mouth at bedtime.     Historical Provider, MD  naproxen sodium (ANAPROX DS) 550 MG tablet Take 1 tablet every 12 hours as needed for pain. Best taken with a meal. Patient not taking: Reported on 12/05/2014 01/06/14   Shanon Rosser, MD  norethindrone (AYGESTIN) 5 MG tablet Take 5 mg by mouth daily.    Historical  Provider, MD  ondansetron (ZOFRAN) 4 MG tablet Take 4 mg by mouth every 6 (six) hours as needed for nausea or vomiting.    Historical Provider, MD  oxyCODONE-acetaminophen (PERCOCET/ROXICET) 5-325 MG per tablet Take 1-2 tablets by mouth every 6 (six) hours as needed for severe pain.    Historical Provider, MD  oxymorphone (OPANA) 10 MG tablet Take 10 mg by mouth 2 (two) times daily.    Historical Provider, MD  QUEtiapine Fumarate (SEROQUEL PO) Take by mouth.    Historical Provider, MD  topiramate (TOPAMAX) 50 MG tablet Take 100 mg by mouth 2 (two) times daily.     Historical Provider, MD  zaleplon (SONATA) 10 MG capsule Take 10 mg by mouth at bedtime as needed for  sleep.    Historical Provider, MD  zonisamide (ZONEGRAN) 50 MG capsule Take 200 mg by mouth at bedtime.     Historical Provider, MD   BP 99/65 mmHg  Pulse 72  Temp(Src) 97.4 F (36.3 C) (Oral)  Resp 14  Ht 5\' 3"  (1.6 m)  Wt 178 lb (80.74 kg)  BMI 31.54 kg/m2  SpO2 100% Physical Exam  Constitutional: She is oriented to person, place, and time. She appears well-developed and well-nourished.  HENT:  Head: Normocephalic and atraumatic.  Right Ear: External ear normal.  Eyes: Conjunctivae and EOM are normal. Pupils are equal, round, and reactive to light.  Neck: Normal range of motion.  Cardiovascular: Normal rate and normal heart sounds.   Pulmonary/Chest: Effort normal.  Abdominal: Soft. She exhibits no distension.  Musculoskeletal: Normal range of motion.  Neurological: She is alert and oriented to person, place, and time.  Skin: Skin is warm.  Psychiatric: She has a normal mood and affect.  Nursing note and vitals reviewed.   ED Course  Procedures (including critical care time) Labs Review Labs Reviewed  POC URINE PREG, ED    Imaging Review Ct Head Wo Contrast  12/05/2014   CLINICAL DATA:  Pain frontal headache earlier today. Photophobia. Patient subsequently passed out and fell. Neck and head pain after fall  EXAM: CT HEAD WITHOUT CONTRAST  CT CERVICAL SPINE WITHOUT CONTRAST  TECHNIQUE: Multidetector CT imaging of the head and cervical spine was performed following the standard protocol without intravenous contrast. Multiplanar CT image reconstructions of the cervical spine were also generated.  COMPARISON:  Head CT March 02, 2014  FINDINGS: CT HEAD FINDINGS  The ventricles are normal in size and configuration. There is artifact from aneurysm coils in the supraclinoid regions bilaterally. There is no demonstrable mass, hemorrhage, extra-axial fluid collection, or midline shift. No gray-white compartment lesions are identified. No acute infarct apparent. The bony calvarium appears  intact. The mastoid air cells are clear.  CT CERVICAL SPINE FINDINGS  There is no fracture or spondylolisthesis. Prevertebral soft tissues and predental space regions are normal. Disc spaces appear intact. No disc extrusion or stenosis. No nerve root edema or effacement.  IMPRESSION: CT head: Coils in each supraclinoid carotid artery region. No intracranial mass, hemorrhage, or extra-axial fluid collection. Gray-white compartments appear normal.  CT cervical spine: No fracture or spondylolisthesis. No appreciable arthropathy.   Electronically Signed   By: Lowella Grip III M.D.   On: 12/05/2014 10:58   Ct Cervical Spine Wo Contrast  12/05/2014   CLINICAL DATA:  Pain frontal headache earlier today. Photophobia. Patient subsequently passed out and fell. Neck and head pain after fall  EXAM: CT HEAD WITHOUT CONTRAST  CT CERVICAL SPINE WITHOUT CONTRAST  TECHNIQUE: Multidetector  CT imaging of the head and cervical spine was performed following the standard protocol without intravenous contrast. Multiplanar CT image reconstructions of the cervical spine were also generated.  COMPARISON:  Head CT March 02, 2014  FINDINGS: CT HEAD FINDINGS  The ventricles are normal in size and configuration. There is artifact from aneurysm coils in the supraclinoid regions bilaterally. There is no demonstrable mass, hemorrhage, extra-axial fluid collection, or midline shift. No gray-white compartment lesions are identified. No acute infarct apparent. The bony calvarium appears intact. The mastoid air cells are clear.  CT CERVICAL SPINE FINDINGS  There is no fracture or spondylolisthesis. Prevertebral soft tissues and predental space regions are normal. Disc spaces appear intact. No disc extrusion or stenosis. No nerve root edema or effacement.  IMPRESSION: CT head: Coils in each supraclinoid carotid artery region. No intracranial mass, hemorrhage, or extra-axial fluid collection. Gray-white compartments appear normal.  CT cervical  spine: No fracture or spondylolisthesis. No appreciable arthropathy.   Electronically Signed   By: Lowella Grip III M.D.   On: 12/05/2014 10:58     EKG Interpretation   Date/Time:  Tuesday December 05 2014 08:28:27 EDT Ventricular Rate:  74 PR Interval:  173 QRS Duration: 96 QT Interval:  405 QTC Calculation: 449 R Axis:   97 Text Interpretation:  Sinus rhythm Borderline right axis deviation  Confirmed by DELOS  MD, DOUGLAS ([id number]) on 12/05/2014 10:12:21 AM      MDM Pt given Iv fluids x 2 liters, reglan, benadryl and torodol.   Pt reports headache is better. Pt able to eat drink and ambulate.   Ct head and ct c spine normal   Final diagnoses:  Fall  Intractable migraine without aura and without status migrainosus  Syncope and collapse        Fransico Meadow, PA-C 12/05/14 1251  Veryl Speak, MD 12/05/14 1422

## 2015-05-23 ENCOUNTER — Encounter (HOSPITAL_BASED_OUTPATIENT_CLINIC_OR_DEPARTMENT_OTHER): Payer: Self-pay

## 2015-05-23 ENCOUNTER — Emergency Department (HOSPITAL_BASED_OUTPATIENT_CLINIC_OR_DEPARTMENT_OTHER)
Admission: EM | Admit: 2015-05-23 | Discharge: 2015-05-23 | Disposition: A | Discharge status: Home / Self Care | Payer: BC Managed Care – PPO | Attending: Emergency Medicine | Admitting: Emergency Medicine

## 2015-05-23 DIAGNOSIS — Z79899 Other long term (current) drug therapy: Secondary | ICD-10-CM | POA: Insufficient documentation

## 2015-05-23 DIAGNOSIS — J029 Acute pharyngitis, unspecified: Secondary | ICD-10-CM

## 2015-05-23 DIAGNOSIS — G43909 Migraine, unspecified, not intractable, without status migrainosus: Secondary | ICD-10-CM | POA: Insufficient documentation

## 2015-05-23 DIAGNOSIS — F419 Anxiety disorder, unspecified: Secondary | ICD-10-CM | POA: Insufficient documentation

## 2015-05-23 DIAGNOSIS — Z8679 Personal history of other diseases of the circulatory system: Secondary | ICD-10-CM | POA: Diagnosis not present

## 2015-05-23 MED ORDER — BENZOCAINE 20 % MT SOLN
OROMUCOSAL | Status: AC
  Filled 2015-05-23: qty 57

## 2015-05-23 MED ORDER — BENZOCAINE (TOPICAL) 20 % EX AERO
INHALATION_SPRAY | Freq: Four times a day (QID) | CUTANEOUS | Status: DC | PRN
  Filled 2015-05-23 (×2): qty 57

## 2015-05-23 MED ORDER — DEXAMETHASONE SODIUM PHOSPHATE 10 MG/ML IJ SOLN
10.0000 mg | Freq: Once | INTRAMUSCULAR | Status: AC
  Administered 2015-05-23: 10 mg via INTRAMUSCULAR
  Filled 2015-05-23: qty 1

## 2015-05-23 MED ORDER — BENZOCAINE (TOPICAL) 20 % EX AERO
INHALATION_SPRAY | Freq: Once | CUTANEOUS | Status: AC
  Administered 2015-05-23: 14:00:00 via OROMUCOSAL
  Filled 2015-05-23: qty 57

## 2015-05-23 MED ORDER — BENZOCAINE (TOPICAL) 20 % EX AERO
INHALATION_SPRAY | Freq: Once | CUTANEOUS | Status: DC
  Filled 2015-05-23: qty 57

## 2015-05-23 MED ORDER — PENICILLIN G BENZATHINE 1200000 UNIT/2ML IM SUSP
1.2000 10*6.[IU] | Freq: Once | INTRAMUSCULAR | Status: AC
  Administered 2015-05-23: 1.2 10*6.[IU] via INTRAMUSCULAR
  Filled 2015-05-23: qty 2

## 2015-05-23 NOTE — ED Provider Notes (Signed)
CSN: [id number]     Arrival date & time 05/23/15  1259 History   First MD Initiated Contact with Patient 05/23/15 1313     Chief Complaint  Patient presents with  . Sore Throat     (Consider location/radiation/quality/duration/timing/severity/associated sxs/prior Treatment) HPI Comments: 2 daughters at home with strep throat  Patient is a 39 y.o. female presenting with pharyngitis. The history is provided by the patient.  Sore Throat This is a new problem. The current episode started more than 2 days ago. The problem occurs constantly. The problem has not changed since onset.Associated symptoms comments: Fever. Tolerating secretions but having difficulty swallowing and antibiotic and steroid. No shortness of breath or cough. . The symptoms are aggravated by swallowing. Nothing relieves the symptoms. She has tried acetaminophen (throat spray) for the symptoms. The treatment provided no relief.    Past Medical History  Diagnosis Date  . Migraine   . Cerebral aneurysm   . Anxiety   . Hematuria     & probable OAB  . Aneurysm     bilateral opthalmic   Past Surgical History  Procedure Laterality Date  . Brain surgery    . Cholecystectomy    . Appendectomy     No family history on file. Social History  Substance Use Topics  . Smoking status: Never Smoker   . Smokeless tobacco: None  . Alcohol Use: No   OB History    No data available     Review of Systems  All other systems reviewed and are negative.     Allergies  Ultram; Ivp dye; Percocet; Pyridium plus; Zomig; Codeine; Ditropan; and Hydrocodone  Home Medications   Prior to Admission medications   Medication Sig Start Date End Date Taking? Authorizing Provider  Fexofenadine HCl (ALLEGRA PO) Take by mouth.   Yes Historical Provider, MD  lamoTRIgine (LAMICTAL) 150 MG tablet Take 150 mg by mouth 2 (two) times daily.   Yes Historical Provider, MD  Melatonin 3 MG TABS Take 3 mg by mouth at bedtime.    Yes Historical  Provider, MD  ondansetron (ZOFRAN) 4 MG tablet Take 4 mg by mouth every 6 (six) hours as needed for nausea or vomiting.   Yes Historical Provider, MD  OVER THE COUNTER MEDICATION Apply 1 application topically 4 (four) times a week. Essential oils   Yes Historical Provider, MD  QUEtiapine (SEROQUEL XR) 300 MG 24 hr tablet Take 300 mg by mouth daily.   Yes Historical Provider, MD  QUEtiapine (SEROQUEL) 300 MG tablet Take 300 mg by mouth at bedtime.   Yes Historical Provider, MD  SUMAtriptan (IMITREX) 100 MG tablet Take 100 mg by mouth every 2 (two) hours as needed for migraine or headache. May repeat in 2 hours if headache persists or recurs.   Yes Historical Provider, MD  zonisamide (ZONEGRAN) 100 MG capsule Take 100 mg by mouth at bedtime.    Yes Historical Provider, MD  zonisamide (ZONEGRAN) 50 MG capsule Take 50 mg by mouth at bedtime.    Yes Historical Provider, MD  omeprazole (PRILOSEC) 20 MG capsule Take 20 mg by mouth at bedtime.    Historical Provider, MD  oxyCODONE-acetaminophen (PERCOCET/ROXICET) 5-325 MG per tablet Take 1-2 tablets by mouth every 6 (six) hours as needed for severe pain.    Historical Provider, MD  oxymorphone (OPANA) 10 MG tablet Take 10 mg by mouth 2 (two) times daily.    Historical Provider, MD  QUEtiapine (SEROQUEL) 400 MG tablet Take 400 mg by  mouth at bedtime.    Historical Provider, MD  QUEtiapine Fumarate (SEROQUEL PO) Take by mouth.    Historical Provider, MD  topiramate (TOPAMAX) 50 MG tablet Take 100 mg by mouth 2 (two) times daily.     Historical Provider, MD  zaleplon (SONATA) 10 MG capsule Take 10 mg by mouth at bedtime as needed for sleep.    Historical Provider, MD   BP 92/55 mmHg  Pulse 81  Temp(Src) 98.2 F (36.8 C) (Oral)  Resp 16  Ht 5\' 3"  (1.6 m)  Wt 192 lb (87.091 kg)  BMI 34.02 kg/m2  SpO2 99%  LMP  (LMP Unknown) Physical Exam  Constitutional: She is oriented to person, place, and time. She appears well-developed and well-nourished. She  appears distressed.  HENT:  Head: Normocephalic and atraumatic.  Mouth/Throat: Posterior oropharyngeal erythema present. No oropharyngeal exudate or posterior oropharyngeal edema.  Tolerating secretions  Eyes: EOM are normal. Pupils are equal, round, and reactive to light.  Cardiovascular: Normal rate, regular rhythm, normal heart sounds and intact distal pulses.  Exam reveals no friction rub.   No murmur heard. Pulmonary/Chest: Effort normal and breath sounds normal. She has no wheezes. She has no rales.  Abdominal: Soft. Bowel sounds are normal. She exhibits no distension. There is no tenderness. There is no rebound and no guarding.  Musculoskeletal: Normal range of motion. She exhibits no tenderness.  No edema  Lymphadenopathy:    She has no cervical adenopathy.  Neurological: She is alert and oriented to person, place, and time. No cranial nerve deficit.  Skin: Skin is warm and dry. No rash noted.  Psychiatric: She has a normal mood and affect. Her behavior is normal.  Nursing note and vitals reviewed.   ED Course  Procedures (including critical care time) Labs Review Labs Reviewed - No data to display  Imaging Review No results found. I have personally reviewed and evaluated these images and lab results as part of my medical decision-making.   EKG Interpretation None      MDM   Final diagnoses:  Pharyngitis    Patient with a 3 day history of pharyngitis type symptoms to [place] on her PCP yesterday and had a negative strep with a culture sent however 2 daughters at home with strep throat and she was treated with anti-bionics and prednisone. She is complaining of difficulty swallowing these medications and Dr. suggested she come here for IM medications. Patient is also trying liquid lidocaine without much improvement in her pain.  On exam patient has no concerning findings for an RPA, PTA or epiglottitis. She has some redness to the left side of the throat but no other  concerning symptoms. She is tolerating her secretions and has normal vital signs. Patient was given IM penicillin and Decadron. Symptoms were improved after Hurricaine spray.    Blanchie Dessert, MD 05/23/15 [phone]

## 2015-05-23 NOTE — ED Notes (Signed)
C/o sore throat since Monday-was seen at [facility] PCP yesterday-positive strep exposure at home but negative strep at PCP office given abx, prednisone, and lidocaine gel

## 2015-05-23 NOTE — ED Notes (Signed)
D/c home with family

## 2015-11-01 ENCOUNTER — Other Ambulatory Visit (HOSPITAL_COMMUNITY): Payer: Self-pay | Admitting: Interventional Radiology

## 2015-11-22 ENCOUNTER — Other Ambulatory Visit (HOSPITAL_COMMUNITY): Payer: Self-pay | Admitting: Interventional Radiology

## 2015-11-22 DIAGNOSIS — I729 Aneurysm of unspecified site: Secondary | ICD-10-CM

## 2015-12-10 ENCOUNTER — Ambulatory Visit (HOSPITAL_COMMUNITY)
Admission: RE | Admit: 2015-12-10 | Discharge: 2015-12-10 | Disposition: A | Discharge status: Home / Self Care | Payer: BC Managed Care – PPO | Source: Ambulatory Visit | Attending: Interventional Radiology | Admitting: Interventional Radiology

## 2015-12-10 ENCOUNTER — Ambulatory Visit (HOSPITAL_COMMUNITY): Payer: BC Managed Care – PPO

## 2015-12-10 DIAGNOSIS — I729 Aneurysm of unspecified site: Secondary | ICD-10-CM

## 2015-12-10 DIAGNOSIS — D1809 Hemangioma of other sites: Secondary | ICD-10-CM | POA: Diagnosis not present

## 2015-12-10 DIAGNOSIS — I671 Cerebral aneurysm, nonruptured: Secondary | ICD-10-CM | POA: Diagnosis not present

## 2015-12-10 MED ORDER — GADOBENATE DIMEGLUMINE 529 MG/ML IV SOLN
15.0000 mL | Freq: Once | INTRAVENOUS | Status: AC | PRN
  Administered 2015-12-10: 10 mL via INTRAVENOUS

## 2015-12-27 ENCOUNTER — Other Ambulatory Visit (HOSPITAL_COMMUNITY): Payer: Self-pay | Admitting: Interventional Radiology

## 2015-12-27 DIAGNOSIS — I729 Aneurysm of unspecified site: Secondary | ICD-10-CM

## 2016-01-17 ENCOUNTER — Ambulatory Visit (HOSPITAL_COMMUNITY)
Admission: RE | Admit: 2016-01-17 | Discharge: 2016-01-17 | Disposition: A | Discharge status: Home / Self Care | Payer: BC Managed Care – PPO | Source: Ambulatory Visit | Attending: Interventional Radiology | Admitting: Interventional Radiology

## 2016-01-17 DIAGNOSIS — I729 Aneurysm of unspecified site: Secondary | ICD-10-CM

## 2016-03-27 ENCOUNTER — Ambulatory Visit (INDEPENDENT_AMBULATORY_CARE_PROVIDER_SITE_OTHER): Payer: BC Managed Care – PPO | Admitting: Family Medicine

## 2016-03-27 ENCOUNTER — Encounter: Payer: Self-pay | Admitting: Family Medicine

## 2016-03-27 DIAGNOSIS — F509 Eating disorder, unspecified: Secondary | ICD-10-CM | POA: Diagnosis not present

## 2016-03-27 NOTE — Progress Notes (Signed)
Medical Nutrition Therapy:  Appt start time: 2202 end time:  1630. PCP Milagros Evener, [place]  Therapist Lilyan Punt  Assessment:  Primary concerns today: Weight management and disordered eating.  Katherine [name] is a single parent of a 12-YO daughter and 8-YO daughter.  She is a Agricultural engineer at [employer].   Her husband committed suicide in 2011, one week before she was scheduled for the first of two surgeries for 2 brain aneurisms.  Med hx includes migraine H/As and bipolar D/O.  Also has had addxn problems with Rx narcotics, so usually attends 5 AA mtgs/wk.  Katherine [name] has a lot of DM in her family, so wants to be at a healthier weight to prevent that.    Weight history includes body image struggles during adolescence (mom was highly critical of her body size).  Senior yr of high school her mom gave her weight loss pills, which led to a 30-lb weight loss.  She gained the weight back during freshman yr of college.  Restarted med's the next summer, but to no effect.  Started extreme restriction during sophomore year, and lost some weight despite no exercise.  Junior year Shawnia met her future husband, and ate healthier, with support from him.  Katherine [name] gained >60 lb with her first pregnancy, and essentially gave up her efforts following birth of her first child.  Her husband started working all the time, and cooking was up to [employer], who relied mostly on processed foods.  Did not lose weight again till after her brain surgery (from size 22 to size 10).  Then she started to feel hunger again in 2012, and gained, followed by extreme exercise and restriction in 2014.  Regained in 2015 or so once she stopped exercising.  Last yr, she started limiting intake to 700 kcal/day, which she managed to moderate, but is worried she's now headed back there.  Last year she signed a contract with therapist Lilyan Punt that she would eat at least 1000 kcal per day.  [name] has been unable to really stick with  the contract.  Learning Readiness: Ready  Usual eating pattern includes 2 meals and 3 snacks per day. Frequent foods and beverages include coffee w/ crm & sugar, 90-kcal sodas (2/day), water, meal replcmt shake (2-3/wk), chx, salmon, corn, grn beans, bananas, apples, melon, berries, Dannon light yogurt.  Avoided foods include processed meat, sausage, hot peppers.   Usual physical activity includes occasional walk.  24-hr recall: (Up at 9 AM) B (9 AM)-   1 c coffee, 2 T crm, 3 t sugar, 2 c Kix, 1/2 c fat-free milk Snk ( AM)-   Water L (12 PM)-  1 ham sandw, 1 c grapes, 3/4 c Doritos, water Snk (2:30)-  1 granola bar, water D (6 PM)-  [employer] 3 pcs fried flounder & 1 1/2 c shrimp, 5 ffs, 1 hushpuppies, 16 oz swt tea Snk ( PM)-  --- Typical day? Yes.  except for days of restriction.  Eats restaurant or takeout food 3-4 X wk.    Progress Towards Goal(s):  In progress.   Nutritional Diagnosis:  NB-1.5 Disordered eating pattern As related to food and weight anxiety.  As evidenced by self-report of such as well as days of severe restriction.    Intervention:  Nutrition counseling   Handouts given during visit include:  AVS  Goals Sheet  Demonstrated degree of understanding via:  Teach Back  Barriers to learning/adherence to lifestyle change: Anxiety & depression  Monitoring/Evaluation:  Dietary intake, exercise, and body weight in 8 week(s).  No appts available sooner.

## 2016-03-27 NOTE — Patient Instructions (Addendum)
-   An ideal way of eating for best nutrition and weight management includes fairly even food distribution through the day; REAL meals as opposed to snacks; lots of veg's.    Specific Goals: 1. Eat at least 3 REAL meals and 1-2 snacks per day.  Aim for no more than 5 hours between eating.  Eat breakfast within one hour of getting up. Include a vegetable at both lunch and dinner at least 4 days per week.    - A real meal includes protein, starch, and vegetables and/or fruit.  2. Set aside at least one time a week to plan:  - what foods to buy so you have good options on hand (both meals and snacks)  - what you'll (probably) have for at least 3 lunches and 2 dinners this next week.    - what you may want to pre-prep this week.   3. Make a list of 7-10 dinner meals that taste good, are relatively quick and easy to prepare, and that meet your nutritional needs.  Use this as a basis for shopping, so you can make one of these meals any time.  Post this list in the kitchen to use for ideas.  Email your list to [email or web address] for review no later than Aug 10.  .   - Complete your Goals Sheet, and bring to follow-up.     Starchy (carb) foods: Bread, rice, pasta, potatoes, corn, cereal, grits, oatmeal, crackers, bagels, muffins, all baked goods.  (Fruits, milk, and yogurt also have carbohydrate, but most of these foods will not spike your blood sugar as the starchy foods will.)  A few fruits do cause high blood sugars; use small portions of bananas, grapes, watermelon, oranges, and most tropical fruits.    Protein foods: Meat, fish, poultry, eggs, dairy foods, and beans such as pinto and kidney beans (beans also provide carbohydrate).

## 2016-04-01 ENCOUNTER — Encounter: Payer: Self-pay | Admitting: Pediatrics

## 2016-04-03 ENCOUNTER — Encounter: Payer: Self-pay | Admitting: Family Medicine

## 2016-04-03 NOTE — Progress Notes (Signed)
Patient was in for weight check; feels anxious re. eating 3 meals a day, and said she is feeling hungry "all the time," so was sure she was gaining weight.  Weight is down 2.5 lb in 1 week.   Patient advised to keep record of what and how much she eats for breakfast, as well as time at which she first notices hunger during the day as a way to help determine best breakfast for appetite management.

## 2016-04-08 ENCOUNTER — Encounter: Payer: Self-pay | Admitting: Family Medicine

## 2016-04-08 NOTE — Progress Notes (Signed)
Patient's weight is up 1.4 lb in 1 week.  Reminded her that this is not a trend unless we have at least 3 numbers.  Encouraged her to continue to eat at least 3 meals a day, with snacks as appropriate.

## 2016-04-22 ENCOUNTER — Encounter: Payer: Self-pay | Admitting: Family Medicine

## 2016-04-22 NOTE — Progress Notes (Signed)
Katherine [name] was here for a weight check.  Weight has gone up another 2 lb despite patient's effort to make prudent food choices.  She will send me a 3-day food record for review.  Next full MNT appt is not until October.

## 2016-06-02 ENCOUNTER — Ambulatory Visit: Payer: BC Managed Care – PPO | Admitting: Family Medicine

## 2016-07-07 ENCOUNTER — Emergency Department (HOSPITAL_COMMUNITY): Payer: BC Managed Care – PPO

## 2016-07-07 ENCOUNTER — Emergency Department (HOSPITAL_BASED_OUTPATIENT_CLINIC_OR_DEPARTMENT_OTHER)
Admission: EM | Admit: 2016-07-07 | Discharge: 2016-07-07 | Disposition: A | Discharge status: Home / Self Care | Payer: BC Managed Care – PPO | Attending: Emergency Medicine | Admitting: Emergency Medicine

## 2016-07-07 ENCOUNTER — Other Ambulatory Visit (HOSPITAL_COMMUNITY): Payer: Self-pay | Admitting: Radiology

## 2016-07-07 ENCOUNTER — Encounter (HOSPITAL_BASED_OUTPATIENT_CLINIC_OR_DEPARTMENT_OTHER): Payer: Self-pay | Admitting: Emergency Medicine

## 2016-07-07 ENCOUNTER — Emergency Department (HOSPITAL_BASED_OUTPATIENT_CLINIC_OR_DEPARTMENT_OTHER): Payer: BC Managed Care – PPO

## 2016-07-07 ENCOUNTER — Other Ambulatory Visit (HOSPITAL_COMMUNITY): Payer: BC Managed Care – PPO

## 2016-07-07 DIAGNOSIS — R531 Weakness: Secondary | ICD-10-CM | POA: Diagnosis not present

## 2016-07-07 DIAGNOSIS — G43109 Migraine with aura, not intractable, without status migrainosus: Secondary | ICD-10-CM

## 2016-07-07 DIAGNOSIS — R29810 Facial weakness: Secondary | ICD-10-CM | POA: Diagnosis present

## 2016-07-07 DIAGNOSIS — Z79899 Other long term (current) drug therapy: Secondary | ICD-10-CM | POA: Diagnosis not present

## 2016-07-07 DIAGNOSIS — R2990 Unspecified symptoms and signs involving the nervous system: Secondary | ICD-10-CM

## 2016-07-07 DIAGNOSIS — G43809 Other migraine, not intractable, without status migrainosus: Secondary | ICD-10-CM | POA: Insufficient documentation

## 2016-07-07 DIAGNOSIS — R413 Other amnesia: Secondary | ICD-10-CM | POA: Insufficient documentation

## 2016-07-07 DIAGNOSIS — R4781 Slurred speech: Secondary | ICD-10-CM | POA: Insufficient documentation

## 2016-07-07 LAB — COMPREHENSIVE METABOLIC PANEL
ALK PHOS: 70 U/L (ref 38–126)
ALT: 21 U/L (ref 14–54)
ANION GAP: 7 (ref 5–15)
AST: 19 U/L (ref 15–41)
Albumin: 4.2 g/dL (ref 3.5–5.0)
BILIRUBIN TOTAL: 0.3 mg/dL (ref 0.3–1.2)
BUN: 17 mg/dL (ref 6–20)
CALCIUM: 8.6 mg/dL — AB (ref 8.9–10.3)
CO2: 19 mmol/L — ABNORMAL LOW (ref 22–32)
Chloride: 111 mmol/L (ref 101–111)
Creatinine, Ser: 0.95 mg/dL (ref 0.44–1.00)
GFR calc non Af Amer: >60 mL/min (ref >60)
Glucose, Bld: 103 mg/dL — ABNORMAL HIGH (ref 65–99)
POTASSIUM: 3.3 mmol/L — AB (ref 3.5–5.1)
SODIUM: 137 mmol/L (ref 135–145)
TOTAL PROTEIN: 6.6 g/dL (ref 6.5–8.1)

## 2016-07-07 LAB — DIFFERENTIAL
Basophils Absolute: 0 10*3/uL (ref 0.0–0.1)
Basophils Relative: 0 %
EOS ABS: 0.1 10*3/uL (ref 0.0–0.7)
EOS PCT: 1 %
LYMPHS ABS: 1.7 10*3/uL (ref 0.7–4.0)
LYMPHS PCT: 36 %
MONO ABS: 0.3 10*3/uL (ref 0.1–1.0)
Monocytes Relative: 7 %
Neutro Abs: 2.5 10*3/uL (ref 1.7–7.7)
Neutrophils Relative %: 56 %

## 2016-07-07 LAB — CBG MONITORING, ED
GLUCOSE-CAPILLARY: 70 mg/dL (ref 65–99)
GLUCOSE-CAPILLARY: 85 mg/dL (ref 65–99)

## 2016-07-07 LAB — APTT: aPTT: 32 seconds (ref 24–36)

## 2016-07-07 LAB — CBC
HCT: 36 % (ref 36.0–46.0)
Hemoglobin: 12 g/dL (ref 12.0–15.0)
MCH: 30 pg (ref 26.0–34.0)
MCHC: 33.3 g/dL (ref 30.0–36.0)
MCV: 90 fL (ref 78.0–100.0)
PLATELETS: 260 10*3/uL (ref 150–400)
RBC: 4 MIL/uL (ref 3.87–5.11)
RDW: 13 % (ref 11.5–15.5)
WBC: 4.6 10*3/uL (ref 4.0–10.5)

## 2016-07-07 LAB — TROPONIN I: Troponin I: <0.03 ng/mL (ref <0.03)

## 2016-07-07 LAB — PROTIME-INR
INR: 1.13
PROTHROMBIN TIME: 14.6 s (ref 11.4–15.2)

## 2016-07-07 LAB — PREGNANCY, URINE: PREG TEST UR: NEGATIVE

## 2016-07-07 MED ORDER — SODIUM CHLORIDE 0.9 % IV BOLUS (SEPSIS)
1000.0000 mL | Freq: Once | INTRAVENOUS | Status: AC
  Administered 2016-07-07: 1000 mL via INTRAVENOUS

## 2016-07-07 MED ORDER — DIPHENHYDRAMINE HCL 50 MG/ML IJ SOLN
25.0000 mg | Freq: Once | INTRAMUSCULAR | Status: DC
Start: 2016-07-07 — End: 2016-07-07
  Filled 2016-07-07: qty 1

## 2016-07-07 MED ORDER — LORAZEPAM 2 MG/ML IJ SOLN
1.0000 mg | Freq: Once | INTRAMUSCULAR | Status: AC
  Administered 2016-07-07: 1 mg via INTRAVENOUS
  Filled 2016-07-07: qty 1

## 2016-07-07 MED ORDER — METOCLOPRAMIDE HCL 5 MG/ML IJ SOLN
10.0000 mg | Freq: Once | INTRAMUSCULAR | Status: AC
  Administered 2016-07-07: 10 mg via INTRAVENOUS
  Filled 2016-07-07: qty 2

## 2016-07-07 MED ORDER — DEXAMETHASONE SODIUM PHOSPHATE 10 MG/ML IJ SOLN: 10.0000 mg | Freq: Once | INTRAMUSCULAR | Status: DC

## 2016-07-07 NOTE — Discharge Instructions (Signed)
Read the information below.  You may return to the Emergency Department at any time for worsening condition or any new symptoms that concern you. °

## 2016-07-07 NOTE — ED Notes (Signed)
Sandwich bag and water provided.

## 2016-07-07 NOTE — ED Notes (Addendum)
Pt arrived via [facility], transferred for MRI. Denies headache, blurred vision or nausea. R hand has slightly weaker grip, moves all other extremities equally. States HA started Friday, R hand weakness started Saturday and confusion began today. Speech is clear, oriented, just slow to speak

## 2016-07-07 NOTE — ED Notes (Signed)
Patient back from CT scan.

## 2016-07-07 NOTE — ED Notes (Signed)
Patient transported to MRI 

## 2016-07-07 NOTE — ED Triage Notes (Signed)
Patient reports that she has some right sided weakness to her grips on Saturday intermittently, The patient reports that she noticed some again on Sunday.  At 730 this morning weakness, sluggish speech and memory loss.

## 2016-07-07 NOTE — ED Provider Notes (Signed)
2:22 PM Patient has arrived to [facility] Emergency Department from [facility] ED, transferred by Dr Myrene Buddy.  Pt with hx brain aneurysm and chronic migraine with 3 days of intermittent left sided facial droop, right sided weakness, sluggish speech, memory loss.  Pt tells me she currently feels well, no pain anywhere, is having some difficulty word-finding.  Plan is for MRI/MRA.  If negative, pt may be treated for complex migraine with outpatient f/u with her neurologist.    CN II-XII intact, there is mild facial asymmetry, EOMs intact, no pronator drift, grip strengths equal bilaterally; strength somewhat weak in upper extremities but symmetric, lower extremities seems equal, sensation intact in all extremities; finger to nose, heel to shin, rapid alternating movements normal.   6:32 PM MRI is negative.  MRA is stable.  Pt is asymptomatic.  D/C home with neurology follow up as planned.  Results for orders placed or performed during the hospital encounter of 07/07/16  Protime-INR  Result Value Ref Range   Prothrombin Time 14.6 11.4 - 15.2 seconds   INR 1.13   APTT  Result Value Ref Range   aPTT 32 24 - 36 seconds  CBC  Result Value Ref Range   WBC 4.6 4.0 - 10.5 K/uL   RBC 4.00 3.87 - 5.11 MIL/uL   Hemoglobin 12.0 12.0 - 15.0 g/dL   HCT 36.0 36.0 - 46.0 %   MCV 90.0 78.0 - 100.0 fL   MCH 30.0 26.0 - 34.0 pg   MCHC 33.3 30.0 - 36.0 g/dL   RDW 13.0 11.5 - 15.5 %   Platelets 260 150 - 400 K/uL  Differential  Result Value Ref Range   Neutrophils Relative % 56 %   Neutro Abs 2.5 1.7 - 7.7 K/uL   Lymphocytes Relative 36 %   Lymphs Abs 1.7 0.7 - 4.0 K/uL   Monocytes Relative 7 %   Monocytes Absolute 0.3 0.1 - 1.0 K/uL   Eosinophils Relative 1 %   Eosinophils Absolute 0.1 0.0 - 0.7 K/uL   Basophils Relative 0 %   Basophils Absolute 0.0 0.0 - 0.1 K/uL  Comprehensive metabolic panel  Result Value Ref Range   Sodium 137 135 - 145 mmol/L   Potassium 3.3 (L) 3.5 - 5.1 mmol/L    Chloride 111 101 - 111 mmol/L   CO2 19 (L) 22 - 32 mmol/L   Glucose, Bld 103 (H) 65 - 99 mg/dL   BUN 17 6 - 20 mg/dL   Creatinine, Ser 0.95 0.44 - 1.00 mg/dL   Calcium 8.6 (L) 8.9 - 10.3 mg/dL   Total Protein 6.6 6.5 - 8.1 g/dL   Albumin 4.2 3.5 - 5.0 g/dL   AST 19 15 - 41 U/L   ALT 21 14 - 54 U/L   Alkaline Phosphatase 70 38 - 126 U/L   Total Bilirubin 0.3 0.3 - 1.2 mg/dL   GFR calc non Af Amer >60 >60 mL/min   GFR calc Af Amer >60 >60 mL/min   Anion gap 7 5 - 15  Troponin I  Result Value Ref Range   Troponin I <0.03 <0.03 ng/mL  Pregnancy, urine  Result Value Ref Range   Preg Test, Ur NEGATIVE NEGATIVE  CBG monitoring, ED  Result Value Ref Range   Glucose-Capillary 70 65 - 99 mg/dL  CBG monitoring, ED  Result Value Ref Range   Glucose-Capillary 85 65 - 99 mg/dL   Ct Head Wo Contrast  Result Date: 07/07/2016 CLINICAL DATA:  Right-sided  weakness since Saturday. EXAM: CT HEAD WITHOUT CONTRAST TECHNIQUE: Contiguous axial images were obtained from the base of the skull through the vertex without intravenous contrast. COMPARISON:  12/05/2014 FINDINGS: Brain: Bilateral carotid artery coils noted in the supraclinoid regions, stable. No acute intracranial abnormality. Specifically, no hemorrhage, hydrocephalus, mass lesion, acute infarction, or significant intracranial injury. Vascular: No hyperdense vessel or unexpected calcification. Skull: No acute calvarial abnormality. Sinuses/Orbits: Visualized paranasal sinuses and mastoids clear. Orbital soft tissues unremarkable. Other: None IMPRESSION: No acute intracranial abnormality. Electronically Signed   By: Rolm Baptise M.D.   On: 07/07/2016 10:47   Mr Angiogram Head Wo Contrast  Result Date: 07/07/2016 CLINICAL DATA:  Intermittent LEFT facial droop, RIGHT-sided weakness, sluggish speech and memory loss for several days. History of bilateral treated para ophthalmic aneurysms 2011, migraine. EXAM: MRI HEAD WITHOUT CONTRAST MRA HEAD  WITHOUT CONTRAST TECHNIQUE: Multiplanar, multiecho pulse sequences of the brain and surrounding structures were obtained without intravenous contrast. Angiographic images of the head were obtained using MRA technique without contrast. COMPARISON:  CT HEAD July 07, 2016 at 1036 hours and MRI/MRA head December 10, 2015 FINDINGS: MRI HEAD FINDINGS BRAIN: No reduced diffusion to suggest acute ischemia. No susceptibility artifact to suggest hemorrhage. The ventricles and sulci are normal for patient's age. Stable minimal bifrontal white matter FLAIR T2 hyperintensities. No suspicious parenchymal signal, masses or mass effect. No abnormal extra-axial fluid collections. No extra-axial masses though, contrast enhanced sequences would be more sensitive. VASCULAR: Normal major intracranial vascular flow voids present at skull base. SKULL AND UPPER CERVICAL SPINE: No abnormal sellar expansion. No suspicious calvarial bone marrow signal. T1 bright ive angioma versus focal fat C4. Craniocervical junction maintained. Cerebellar tonsils descend 3 mm below the foramen magnum though, are not pointed in appearance. SINUSES/ORBITS: Trace ethmoid mucosal thickening. Trace bilateral mastoid effusions. Status post RIGHT ocular lens implant. The included ocular globes and orbital contents are non-suspicious. OTHER: None. MRA HEAD FINDINGS ANTERIOR CIRCULATION: Normal flow related enhancement of the included cervical, petrous, cavernous and supraclinoid internal carotid arteries. Status post bilateral para ophthalmic artery aneurysm coil repair which results in susceptibility artifact attenuating the internal carotid artery flow voids. 1 x 1 x 4 mm medially directed RIGHT para ophthalmic aneurysm is unchanged. Patent anterior communicating artery. Normal flow related enhancement of the anterior and middle cerebral arteries, including distal segments. No large vessel occlusion, high-grade stenosis, abnormal luminal irregularity. POSTERIOR  CIRCULATION: Codominant vertebral arteries. Basilar artery is patent, with normal flow related enhancement of the main branch vessels. Normal flow related enhancement of the posterior cerebral arteries. Patent bilateral posterior communicating arteries. No large vessel occlusion, high-grade stenosis, abnormal luminal irregularity, aneurysm. IMPRESSION: MRI HEAD: No acute intracranial process. Stable examination including minimal chronic small vessel ischemic disease. MRA HEAD: Status post coil embolization of bilateral para ophthalmic aneurysms with stable 1 x 1 x 4 mm recurrent RIGHT para ophthalmic aneurysm. No acute vascular process. Electronically Signed   By: Elon Alas M.D.   On: 07/07/2016 17:47   Mr Brain Wo Contrast  Result Date: 07/07/2016 CLINICAL DATA:  Intermittent LEFT facial droop, RIGHT-sided weakness, sluggish speech and memory loss for several days. History of bilateral treated para ophthalmic aneurysms 2011, migraine. EXAM: MRI HEAD WITHOUT CONTRAST MRA HEAD WITHOUT CONTRAST TECHNIQUE: Multiplanar, multiecho pulse sequences of the brain and surrounding structures were obtained without intravenous contrast. Angiographic images of the head were obtained using MRA technique without contrast. COMPARISON:  CT HEAD July 07, 2016 at 1036 hours and MRI/MRA head December 10, 2015 FINDINGS: MRI HEAD FINDINGS BRAIN: No reduced diffusion to suggest acute ischemia. No susceptibility artifact to suggest hemorrhage. The ventricles and sulci are normal for patient's age. Stable minimal bifrontal white matter FLAIR T2 hyperintensities. No suspicious parenchymal signal, masses or mass effect. No abnormal extra-axial fluid collections. No extra-axial masses though, contrast enhanced sequences would be more sensitive. VASCULAR: Normal major intracranial vascular flow voids present at skull base. SKULL AND UPPER CERVICAL SPINE: No abnormal sellar expansion. No suspicious calvarial bone marrow signal. T1  bright ive angioma versus focal fat C4. Craniocervical junction maintained. Cerebellar tonsils descend 3 mm below the foramen magnum though, are not pointed in appearance. SINUSES/ORBITS: Trace ethmoid mucosal thickening. Trace bilateral mastoid effusions. Status post RIGHT ocular lens implant. The included ocular globes and orbital contents are non-suspicious. OTHER: None. MRA HEAD FINDINGS ANTERIOR CIRCULATION: Normal flow related enhancement of the included cervical, petrous, cavernous and supraclinoid internal carotid arteries. Status post bilateral para ophthalmic artery aneurysm coil repair which results in susceptibility artifact attenuating the internal carotid artery flow voids. 1 x 1 x 4 mm medially directed RIGHT para ophthalmic aneurysm is unchanged. Patent anterior communicating artery. Normal flow related enhancement of the anterior and middle cerebral arteries, including distal segments. No large vessel occlusion, high-grade stenosis, abnormal luminal irregularity. POSTERIOR CIRCULATION: Codominant vertebral arteries. Basilar artery is patent, with normal flow related enhancement of the main branch vessels. Normal flow related enhancement of the posterior cerebral arteries. Patent bilateral posterior communicating arteries. No large vessel occlusion, high-grade stenosis, abnormal luminal irregularity, aneurysm. IMPRESSION: MRI HEAD: No acute intracranial process. Stable examination including minimal chronic small vessel ischemic disease. MRA HEAD: Status post coil embolization of bilateral para ophthalmic aneurysms with stable 1 x 1 x 4 mm recurrent RIGHT para ophthalmic aneurysm. No acute vascular process. Electronically Signed   By: Elon Alas M.D.   On: 07/07/2016 17:47      Clayton Bibles, PA-C 07/07/16 [place], MD 07/08/16 [phone]

## 2016-07-07 NOTE — ED Notes (Signed)
Pt still in MRI 

## 2016-07-07 NOTE — ED Notes (Signed)
Per PA pt is able to eat.

## 2016-07-07 NOTE — ED Provider Notes (Signed)
[place] DEPT [facility] Provider Note   CSN: [id number] Arrival date & time: 07/07/16  1019     History   Chief Complaint Chief Complaint  Patient presents with  . Stroke Symptoms    HPI Johnnie D Greiff is a 40 y.o. female.  HPI 40 year old female with past medical history of brain aneurysms, chronic migraines, who presents with intermittent episodes of left-sided facial droop, right-sided weakness, sluggish speech, and memory loss for the last several days. Patient states over the last 3 days, she has had increasingly frequent episodes in which she feels lightheaded, confused, and developed left facial droop and right-sided weakness. She has had these symptoms previously with her complex migraines but states she has not had any headache with these symptoms over the last several days. She called her neurologist who advised ED evaluation. She denies any other neurological changes. Denies any vision changes. Her aneurysms were stable on recent MRI. Denies any medication changes. Denies any fevers or chills. No neck pain or stiffness.  Past Medical History:  Diagnosis Date  . Aneurysm ([place])    bilateral opthalmic  . Anxiety   . Cerebral aneurysm   . Hematuria    & probable OAB  . Migraine     There are no active problems to display for this patient.   Past Surgical History:  Procedure Laterality Date  . APPENDECTOMY    . BRAIN SURGERY    . CHOLECYSTECTOMY      OB History    No data available       Home Medications    Prior to Admission medications   Medication Sig Start Date End Date Taking? Authorizing Provider  Fexofenadine HCl (ALLEGRA PO) Take by mouth.    Historical Provider, MD  lamoTRIgine (LAMICTAL) 150 MG tablet Take 150 mg by mouth 2 (two) times daily.    Historical Provider, MD  Melatonin 3 MG TABS Take 3 mg by mouth at bedtime.     Historical Provider, MD  omeprazole (PRILOSEC) 20 MG capsule Take 20 mg by mouth at bedtime.    Historical Provider, MD    ondansetron (ZOFRAN) 4 MG tablet Take 4 mg by mouth every 6 (six) hours as needed for nausea or vomiting.    Historical Provider, MD  OVER THE COUNTER MEDICATION Apply 1 application topically 4 (four) times a week. Essential oils    Historical Provider, MD  QUEtiapine (SEROQUEL XR) 300 MG 24 hr tablet Take 300 mg by mouth daily.    Historical Provider, MD  QUEtiapine (SEROQUEL) 300 MG tablet Take 300 mg by mouth at bedtime.    Historical Provider, MD  SUMAtriptan (IMITREX) 100 MG tablet Take 100 mg by mouth every 2 (two) hours as needed for migraine or headache. May repeat in 2 hours if headache persists or recurs.    Historical Provider, MD  zonisamide (ZONEGRAN) 100 MG capsule Take 600 mg by mouth at bedtime.     Historical Provider, MD    Family History History reviewed. No pertinent family history.  Social History Social History  Substance Use Topics  . Smoking status: Never Smoker  . Smokeless tobacco: Never Used  . Alcohol use No     Allergies   Ultram [tramadol hcl]; Ivp dye [iodinated diagnostic agents]; Percocet [oxycodone-acetaminophen]; Pyridium plus [phenazopyridine-butabarb-hyosc]; Zomig [zolmitriptan]; Codeine; Ditropan [oxybutynin chloride]; and Hydrocodone   Review of Systems Review of Systems  Constitutional: Positive for fatigue. Negative for chills and fever.  HENT: Negative for congestion, rhinorrhea and sore throat.  Eyes: Negative for visual disturbance.  Respiratory: Negative for cough, shortness of breath and wheezing.   Cardiovascular: Negative for chest pain and leg swelling.  Gastrointestinal: Negative for abdominal pain, diarrhea, nausea and vomiting.  Genitourinary: Negative for dysuria, flank pain, vaginal bleeding and vaginal discharge.  Musculoskeletal: Negative for neck pain.  Skin: Negative for rash.  Allergic/Immunologic: Negative for immunocompromised state.  Neurological: Positive for facial asymmetry, weakness and headaches. Negative for  syncope.  Hematological: Does not bruise/bleed easily.  All other systems reviewed and are negative.    Physical Exam Updated Vital Signs BP (!) 89/58   Pulse 72   Resp 11   SpO2 100%   Physical Exam  Constitutional: She is oriented to person, place, and time. She appears well-developed and well-nourished. No distress.  HENT:  Head: Normocephalic and atraumatic.  Eyes: Conjunctivae are normal.  Neck: Neck supple.  Cardiovascular: Normal rate, regular rhythm and normal heart sounds.  Exam reveals no friction rub.   No murmur heard. Pulmonary/Chest: Effort normal and breath sounds normal. No respiratory distress. She has no wheezes. She has no rales.  Abdominal: She exhibits no distension.  Musculoskeletal: She exhibits no edema.  Neurological: She is alert and oriented to person, place, and time. She exhibits normal muscle tone.  Skin: Skin is warm. Capillary refill takes less than 2 seconds.  Psychiatric: She has a normal mood and affect.  Nursing note and vitals reviewed.   Neurological Exam:  Mental Status: Alert and oriented to person, place, and time. Attention and concentration normal. Speech clear. Recent memory is intact. Cranial Nerves: Visual fields intact to confrontation in all quadrants bilaterally. EOMI and PERRLA. No nystagmus noted. Facial sensation intact at forehead, maxillary cheek, and chin/mandible bilaterally. No weakness of masticatory muscles. Subtle flattening of left NLF. Hearing grossly normal to finer rub. Uvula is midline, and palate elevates symmetrically. Normal SCM and trapezius strength. Tongue midline without fasciculations Motor: Muscle strength 5/5 in proximal and distal UE and LE bilaterally. No pronator drift. Muscle tone normal. Reflexes: 2+ and symmetrical in all four extremities.  Sensation: Intact to light touch in upper and lower extremities distally bilaterally.  Gait: Normal without ataxia. Coordination: Normal FTN  bilaterally.     ED Treatments / Results  Labs (all labs ordered are listed, but only abnormal results are displayed) Labs Reviewed  PROTIME-INR  APTT  CBC  DIFFERENTIAL  PREGNANCY, URINE  COMPREHENSIVE METABOLIC PANEL  TROPONIN I  CBG MONITORING, ED    EKG  EKG Interpretation None       Radiology Ct Head Wo Contrast  Result Date: 07/07/2016 CLINICAL DATA:  Right-sided weakness since Saturday. EXAM: CT HEAD WITHOUT CONTRAST TECHNIQUE: Contiguous axial images were obtained from the base of the skull through the vertex without intravenous contrast. COMPARISON:  12/05/2014 FINDINGS: Brain: Bilateral carotid artery coils noted in the supraclinoid regions, stable. No acute intracranial abnormality. Specifically, no hemorrhage, hydrocephalus, mass lesion, acute infarction, or significant intracranial injury. Vascular: No hyperdense vessel or unexpected calcification. Skull: No acute calvarial abnormality. Sinuses/Orbits: Visualized paranasal sinuses and mastoids clear. Orbital soft tissues unremarkable. Other: None IMPRESSION: No acute intracranial abnormality. Electronically Signed   By: Rolm Baptise M.D.   On: 07/07/2016 10:47    Procedures Procedures (including critical care time)  Medications Ordered in ED Medications  diphenhydrAMINE (BENADRYL) injection 25 mg (25 mg Intravenous Not Given 07/07/16 1103)  sodium chloride 0.9 % bolus 1,000 mL (1,000 mLs Intravenous New Bag/Given 07/07/16 1114)  dexamethasone (DECADRON) injection 10  mg (10 mg Intravenous Refused 07/07/16 1117)  metoCLOPramide (REGLAN) injection 10 mg (10 mg Intravenous Given 07/07/16 1103)     Initial Impression / Assessment and Plan / ED Course  I have reviewed the triage vital signs and the nursing notes.  Pertinent labs & imaging results that were available during my care of the patient were reviewed by me and considered in my medical decision making (see chart for details).  Clinical Course      40 year old female with past medical history of brain aneurysms, chronic migraines, who presents with intermittent episodes of left facial numbness/weakness, right-sided weakness, and confusion. On arrival, patient noted to have subtle flattening of left nasolabial fold which improved while in the ED. Concern for complex migraine versus TIA versus CVA. Must also consider aneurysmal mass effect. Patient is otherwise well-appearing with no fevers or signs of meningitis or encephalitis. I discussed with her neurologist at [facility], Dr. Everette Rank. Will obtain MRI/MRA for evaluation and if negative, treat for complex migraine with outpatient follow-up. Patient in agreement with this plan. Will transferred to [facility] for MRI.  Final Clinical Impressions(s) / ED Diagnoses   Final diagnoses:  Stroke-like symptom      Duffy Bruce, MD 07/07/16 1319

## 2016-07-08 ENCOUNTER — Ambulatory Visit (INDEPENDENT_AMBULATORY_CARE_PROVIDER_SITE_OTHER): Payer: BC Managed Care – PPO | Admitting: Family Medicine

## 2016-07-08 ENCOUNTER — Encounter: Payer: Self-pay | Admitting: Family Medicine

## 2016-07-08 DIAGNOSIS — F509 Eating disorder, unspecified: Secondary | ICD-10-CM

## 2016-07-08 NOTE — Patient Instructions (Addendum)
Urge 911 Process: - Watch the 30-min or 60-min video at PhoneCaptions.uy.   - Practice Step One (parts 1, 2, and 3), identifying a automatic negative thought.    Use the exact wording on the handout provided today.    Goals remain the same: 1. Eat at least 3 REAL meals and 1-2 snacks per day.  Aim for no more than 5 hours between eating.  Eat breakfast within one hour of getting up. Include a vegetable at both lunch and dinner at least 4 days per week.               - A real meal includes protein, starch, and vegetables and/or fruit.  2. Obtain veg's twice daily.   3. Set aside at least one time a week to plan:             - what foods to buy so you have good options on hand (both meals and snacks)             - what you'll (probably) have for at least 3 lunches and 2 dinners this next week.               - what you may want to pre-prep this week.

## 2016-07-08 NOTE — Progress Notes (Signed)
Medical Nutrition Therapy:  Appt start time: 1600 end time:  1700. PCP Milagros Evener, [place]  Therapist Lilyan Punt  Assessment:  Primary concerns today: Weight management and disordered eating.  Katherine [name] has been having memory problems in recent weeks, and she was in the ER yesterday with slurred speech and memory concerns.  Her diagnosis was migraine, but without headache.  She called her neurologist first thing this morning, but did not get a call back from them till on her way to today's appt.  Her BP was very low while at the ER, mostly 80s over 50s.  BG was in the 70, which rose to 79 once she drank some o.j. and pb crackers.    Katherine [name] has stopped counting kcal.  She has been planning meals each week, which has been helpful to her decision making.  It has often been a struggle to get her daughters to try new things.  Se has been keeping her Goals Sheet, which shows inconsistent success with getting three meals a day and with getting vegetables.  She is still seeing therapist Lilyan Punt regularly, which has been helpful in dealing with a great deal of stress, mostly work-related.  Katherine [name] sees a clear link between stress levels and falling short of meeting her goals.    We reviewed the Urge 911 process of handling derailing thoughts, and Katherine [name] [name] to practice at least step 1.  I encouraged her to work only on this step, as she could not identify any difference between saying a negative thought aloud and saying it preceded by, "I am aware that I am having the thought..."    24-hr recall:  (Up at 6 AM) B (7:10 AM)-  12 oz coffee, 3 T crm, 6 tsp sugar, 1 mini-bagel, 2 T crm chs, 1 banana Snk ( AM)-  WENT TO ER WITH SLURRED SPEECH ~9:30; GOT O.J. & CRACKERS IN AM.   L ( PM)-  WENT TO [place] FOR MRA AT ~MID-DAY; LEFT HOSP ~6 pm.  Snk (2 PM)-  PB crackers; BG was 79 before eating; up to 85 after Pepsi & more crackers.   D ( PM)-  1 [place] sandwich, water Snk ( PM)-  --- (Sunday  evening (day before ER visit yesterday): Dinner was chx, potatoes, grn beans at ~6:30 PM.)   Typical day? No.  More typical intake:  Similar bkfast like today; lunch is usually leftovers, e.g., chx, grn beans, swt potatoes.  Afternoon snack is yogurt + fresh fruit.  Dinner is usually a meat, starch, veg.   Progress Towards Goal(s):  In progress.   Nutritional Diagnosis:  NB-1.5 Disordered eating pattern As related to food and weight anxiety.  As evidenced by erratic eating patterns as she responds to stress with food or food restriction.    Intervention:  Nutrition counseling   Handouts given during visit include:  AVS  Goals Sheet  Demonstrated degree of understanding via:  Teach Back  Barriers to learning/adherence to lifestyle change: Anxiety & depression  Monitoring/Evaluation:  Dietary intake, exercise, and body weight in 8 week(s).  No appts available sooner.

## 2016-09-08 ENCOUNTER — Ambulatory Visit (INDEPENDENT_AMBULATORY_CARE_PROVIDER_SITE_OTHER): Payer: BC Managed Care – PPO | Admitting: Family Medicine

## 2016-09-08 ENCOUNTER — Encounter: Payer: Self-pay | Admitting: Family Medicine

## 2016-09-08 DIAGNOSIS — F509 Eating disorder, unspecified: Secondary | ICD-10-CM | POA: Diagnosis not present

## 2016-09-08 NOTE — Progress Notes (Signed)
Medical Nutrition Therapy:  Appt start time: [id number] end time:  1630. PCP Katherine [name], [place]  Therapist Katherine [name]  Assessment:  Primary concerns today: Weight management and disordered eating.  Katherine [name] said her life has been a bit of an emotional roller coaster in the past couple of months, which means her eating has followed suit, i.e., bingeing in response to stressful times.  She has felt especially depressed following the holidays.  She is still seeing therapist Katherine [name] 2 X month, and feels it is not feasible to increase this frequency in light of her two daughters' (ages 46 and 46) activities and needs.    No recall today.  Katherine [name] talked a fair amount about her current depression (and anxiety) and how it is immobilizing her in terms of self-care behaviors.  We talked about how adequate nutrition will likely help her to have both the emotional and physical energy to follow through on behaviors she knows are important to her well-being.    Progress Towards Goal(s):  In progress.   Nutritional Diagnosis:  NB-1.5 Disordered eating pattern As related to food and weight anxiety.  As evidenced by erratic eating patterns as she responds to stress with food or food restriction.    Intervention:  Nutrition counseling   Handouts given during visit include:  AVS  Meal planning form (also emailed)   Demonstrated degree of understanding via:  Teach Back  Barriers to learning/adherence to lifestyle change: Anxiety & depression  Monitoring/Evaluation:  Dietary intake, exercise, and body weight in 4 week(s).

## 2016-09-08 NOTE — Patient Instructions (Signed)
-   Use the form provided today to plan at least 7 meals, as well as a shopping list for the week.    - Add to your list of meals some "Friday night" options that involve no cooking!  - Consider some new foods/recipes to try (Internet!)  - Engineer, agricultural.   - Eat at least 3 REAL meals and 1-2 snacks per day.  Aim for no more than 5 hours between eating.  Eat breakfast within one hour of getting up.

## 2016-10-06 ENCOUNTER — Ambulatory Visit: Payer: BC Managed Care – PPO | Admitting: Family Medicine

## 2016-11-03 ENCOUNTER — Ambulatory Visit: Payer: BC Managed Care – PPO | Admitting: Family Medicine

## 2016-11-04 ENCOUNTER — Encounter: Payer: Self-pay | Admitting: Family Medicine

## 2016-11-04 ENCOUNTER — Ambulatory Visit (INDEPENDENT_AMBULATORY_CARE_PROVIDER_SITE_OTHER): Payer: BC Managed Care – PPO | Admitting: Family Medicine

## 2016-11-04 DIAGNOSIS — F509 Eating disorder, unspecified: Secondary | ICD-10-CM | POA: Diagnosis not present

## 2016-11-04 NOTE — Patient Instructions (Addendum)
Food preparation: Start with what you know best, and branch out from there.    For example, how can you modify spaghetti to include more veg's at that meal, and less starch?  - Try microwaving veg's on your plate (or leftovers) before you add pasta and sauce.   Beef and pork:  Try tenderloin.   Look for some crockpot recipes.   Plan your week's meals in advance:  Include some sure-thing meals each week.    Consider meal TYPES, and think about what you can do to modify each, e.g,.   - Salads  - Soups  - Sandwiches (includes hot/cold sandwiches/wraps and burritos)  - Protein, starch, veg's meal  - If you are not feeling hungry, think twice about your food choice, i.e., when eating out of boredom, stop to recognize what is driving your food choice, and then ask, what are my options?  And in these circumstances, decide on your snack, put it on a plate/in a bowl, and sit down to eat with full attention.    Email Jeannie some meal ideas for feedback/suggestions.   Goals: - Weekly planning for meals.   - Experiment with how to do this in the way that works best for you.    - Look for alternatives for your own meals that do not affect what the kids are eating.

## 2016-11-04 NOTE — Progress Notes (Signed)
Medical Nutrition Therapy:  Appt start time: 1287 end time:  1630. PCP Milagros Evener, [place]  Therapist Lilyan Punt  Assessment:  Primary concerns today: Weight management and disordered eating.  Katherine [name] felt her food choices went well for the first few weeks following last nutrition appt.  She said she gets discouraged when a meal "flops," which happened a couple of times (= her daughters don't like what she's prepared).  Then her daughter got the flu, and Keelan got a stomach virus at the same time, so they have been out of a routine of home-prepared meals.    Another barrier to better food choices is that [place] rarely feels hunger.  She said yesterday's snack was mainly out of boredom.  She does feel that food choices are better when she manages to plan ahead, however, so wants to keep this as a goal.  She had been using the meal planning form provided at last appt.    24-hr recall:  (Up at 8:30 AM) B (9 AM)-  1 c coffee, 1.5 tsp sugar, 3 t creamer, 2 c Sp K, 1/2 c f-f milk Snk ( AM)-  water L (12:30 PM)-  1 slc chs pizza, carrots, water Snk (3 PM)-  1/2 c blueberries, 2 Chips Ahoy ckies D (5:30 PM)-  1 waffle, 2 T syrup, 90-kcal Sprite  Snk ( PM)-  --- Typical day? Yes.  (Was home for snow day.)  Progress Towards Goal(s):  In progress.   Nutritional Diagnosis:  NB-1.5 Disordered eating pattern As related to food and weight anxiety.  As evidenced by erratic eating patterns as she responds to stress with food or food restriction.    Intervention:  Nutrition counseling   Handouts given during visit include:  AVS  Demonstrated degree of understanding via:  Teach Back  Barriers to learning/adherence to lifestyle change: Anxiety & depression  Monitoring/Evaluation:  Dietary intake, exercise, and body weight in 7 week(s).  No 4 PM appt available sooner.

## 2016-12-25 ENCOUNTER — Ambulatory Visit: Payer: BC Managed Care – PPO | Admitting: Family Medicine

## 2016-12-30 ENCOUNTER — Telehealth (HOSPITAL_COMMUNITY): Payer: Self-pay

## 2016-12-30 NOTE — Telephone Encounter (Signed)
Called to schedule mri, left message for pt to return call. AW 

## 2016-12-31 ENCOUNTER — Other Ambulatory Visit (HOSPITAL_COMMUNITY): Payer: Self-pay | Admitting: Interventional Radiology

## 2016-12-31 DIAGNOSIS — I729 Aneurysm of unspecified site: Secondary | ICD-10-CM

## 2017-01-13 ENCOUNTER — Ambulatory Visit (HOSPITAL_COMMUNITY)
Admission: RE | Admit: 2017-01-13 | Discharge: 2017-01-13 | Disposition: A | Discharge status: Home / Self Care | Payer: BC Managed Care – PPO | Source: Ambulatory Visit | Attending: Interventional Radiology | Admitting: Interventional Radiology

## 2017-01-13 ENCOUNTER — Ambulatory Visit (HOSPITAL_COMMUNITY): Payer: BC Managed Care – PPO

## 2017-01-13 ENCOUNTER — Encounter (HOSPITAL_COMMUNITY): Payer: Self-pay

## 2017-01-13 DIAGNOSIS — R9082 White matter disease, unspecified: Secondary | ICD-10-CM | POA: Diagnosis not present

## 2017-01-13 DIAGNOSIS — I729 Aneurysm of unspecified site: Secondary | ICD-10-CM | POA: Diagnosis present

## 2017-01-13 DIAGNOSIS — I671 Cerebral aneurysm, nonruptured: Secondary | ICD-10-CM | POA: Insufficient documentation

## 2017-01-13 MED ORDER — GADOBENATE DIMEGLUMINE 529 MG/ML IV SOLN
15.0000 mL | Freq: Once | INTRAVENOUS | Status: AC | PRN
  Administered 2017-01-13: 15 mL via INTRAVENOUS

## 2017-01-27 ENCOUNTER — Telehealth (HOSPITAL_COMMUNITY): Payer: Self-pay

## 2017-01-27 NOTE — Telephone Encounter (Signed)
Pt agreed to f/u in 1 yr with mri/mra. AW 

## 2017-02-17 ENCOUNTER — Ambulatory Visit: Payer: BC Managed Care – PPO | Admitting: Family Medicine

## 2017-04-10 DIAGNOSIS — R0789 Other chest pain: Secondary | ICD-10-CM | POA: Diagnosis not present

## 2017-04-10 DIAGNOSIS — R51 Headache: Secondary | ICD-10-CM | POA: Diagnosis not present

## 2017-04-10 DIAGNOSIS — R079 Chest pain, unspecified: Secondary | ICD-10-CM | POA: Diagnosis present

## 2017-04-10 DIAGNOSIS — Z79899 Other long term (current) drug therapy: Secondary | ICD-10-CM | POA: Insufficient documentation

## 2017-04-11 ENCOUNTER — Emergency Department (HOSPITAL_COMMUNITY): Payer: BC Managed Care – PPO

## 2017-04-11 ENCOUNTER — Emergency Department (HOSPITAL_COMMUNITY)
Admission: EM | Admit: 2017-04-11 | Discharge: 2017-04-11 | Disposition: A | Discharge status: Home / Self Care | Payer: BC Managed Care – PPO | Attending: Emergency Medicine | Admitting: Emergency Medicine

## 2017-04-11 ENCOUNTER — Encounter (HOSPITAL_COMMUNITY): Payer: Self-pay | Admitting: Emergency Medicine

## 2017-04-11 DIAGNOSIS — R519 Headache, unspecified: Secondary | ICD-10-CM

## 2017-04-11 DIAGNOSIS — R0789 Other chest pain: Secondary | ICD-10-CM

## 2017-04-11 DIAGNOSIS — R51 Headache: Secondary | ICD-10-CM

## 2017-04-11 LAB — CBC
HEMATOCRIT: 36.1 % (ref 36.0–46.0)
HEMOGLOBIN: 12.3 g/dL (ref 12.0–15.0)
MCH: 29.7 pg (ref 26.0–34.0)
MCHC: 34.1 g/dL (ref 30.0–36.0)
MCV: 87.2 fL (ref 78.0–100.0)
Platelets: 226 10*3/uL (ref 150–400)
RBC: 4.14 MIL/uL (ref 3.87–5.11)
RDW: 13.2 % (ref 11.5–15.5)
WBC: 6.4 10*3/uL (ref 4.0–10.5)

## 2017-04-11 LAB — I-STAT TROPONIN, ED: Troponin i, poc: 0 ng/mL (ref 0.00–0.08)

## 2017-04-11 LAB — BASIC METABOLIC PANEL
ANION GAP: 7 (ref 5–15)
BUN: 13 mg/dL (ref 6–20)
CALCIUM: 9.2 mg/dL (ref 8.9–10.3)
CHLORIDE: 111 mmol/L (ref 101–111)
CO2: 23 mmol/L (ref 22–32)
Creatinine, Ser: 0.98 mg/dL (ref 0.44–1.00)
GFR calc Af Amer: >60 mL/min (ref >60)
GFR calc non Af Amer: >60 mL/min (ref >60)
GLUCOSE: 109 mg/dL — AB (ref 65–99)
POTASSIUM: 3.5 mmol/L (ref 3.5–5.1)
Sodium: 141 mmol/L (ref 135–145)

## 2017-04-11 NOTE — ED Notes (Signed)
EDP at bedside  

## 2017-04-11 NOTE — ED Notes (Signed)
Patient given double gowns for dressing and a warm blanket before being sent to lobby

## 2017-04-11 NOTE — ED Provider Notes (Signed)
[facility] Provider Note   CSN: [id number] Arrival date & time: 04/10/17  2351     History   Chief Complaint Chief Complaint  Patient presents with  . Headache  . Chest Pain    HPI Katherine [name] is a 41 y.o. female.  41 yo F With a chief complaint of chest pain. The patient lay down to go to sleep and then had sudden sharp chest pain to the left side of her chest. She felt that radiated to her head.Had a bit of a headache with this as well. othing seemed to make this better or worse. Lasted for about 20 minutes and then resolved. Rarely has no symptoms.   The history is provided by the patient.  Headache   Pertinent negatives include no fever, no palpitations, no shortness of breath, no nausea and no vomiting.  Chest Pain   Associated symptoms include headaches. Pertinent negatives include no dizziness, no fever, no nausea, no palpitations, no shortness of breath and no vomiting.  Illness  This is a new problem. The current episode started 6 to 12 hours ago. The problem occurs rarely. The problem has been resolved. Associated symptoms include chest pain and headaches. Pertinent negatives include no shortness of breath. Nothing aggravates the symptoms. Nothing relieves the symptoms. She has tried nothing for the symptoms. The treatment provided no relief.    Past Medical History:  Diagnosis Date  . 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 Anxiety   . Cerebral aneurysm   . Hematuria    & probable OAB  . Migraine     There are no active problems to display for this patient.   Past Surgical History:  Procedure Laterality Date  . APPENDECTOMY    . BRAIN SURGERY    . CHOLECYSTECTOMY      OB History    No data available       Home Medications    Prior to Admission medications   Medication Sig Start Date End Date Taking? Authorizing Provider  eletriptan (RELPAX) 40 MG tablet Take 40 mg by mouth as needed for migraine or headache. May repeat in 2 hours  if headache persists or recurs.    [provider]  Fexofenadine HCl (ALLEGRA PO) Take by mouth.    [provider]  lamoTRIgine (LAMICTAL) 150 MG tablet Take 150 mg by mouth 2 (two) times daily.    [provider]  leuprolide (LUPRON) 11.25 MG injection Inject 11.25 mg into the muscle every 3 (three) months.    [provider]  Melatonin 3 MG TABS Take 3 mg by mouth at bedtime.     [provider]  omeprazole (PRILOSEC) 20 MG capsule Take 20 mg by mouth at bedtime.    [provider]  ondansetron (ZOFRAN) 4 MG tablet Take 4 mg by mouth every 6 (six) hours as needed for nausea or vomiting.    [provider]  OVER THE COUNTER MEDICATION Apply 1 application topically 4 (four) times a week. Essential oils    [provider]  QUEtiapine (SEROQUEL XR) 300 MG 24 hr tablet Take 300 mg by mouth daily.    [provider]  QUEtiapine (SEROQUEL) 100 MG tablet Take 100 mg by mouth at bedtime.    [provider]  SUMAtriptan (IMITREX) 100 MG tablet Take 100 mg by mouth every 2 (two) hours as needed for migraine or headache. May repeat in 2 hours if headache persists or recurs.  [provider]  zonisamide (ZONEGRAN) 100 MG capsule Take 600 mg by mouth at bedtime.     [provider]    Family History History reviewed. No pertinent family history.  Social History Social History  Substance Use Topics  . Smoking status: Never Smoker  . Smokeless tobacco: Never Used  . Alcohol use No     Allergies   Ultram [tramadol hcl]; Ivp dye [iodinated diagnostic agents]; Percocet [oxycodone-acetaminophen]; Pyridium plus [phenazopyridine-butabarb-hyosc]; Zomig [zolmitriptan]; Codeine; Ditropan [oxybutynin chloride]; and Hydrocodone   Review of Systems Review of Systems  Constitutional: Negative for chills and fever.  HENT: Negative for congestion and rhinorrhea.   Eyes: Negative for redness and visual  disturbance.  Respiratory: Negative for shortness of breath and wheezing.   Cardiovascular: Positive for chest pain. Negative for palpitations.  Gastrointestinal: Negative for nausea and vomiting.  Genitourinary: Negative for dysuria and urgency.  Musculoskeletal: Negative for arthralgias and myalgias.  Skin: Negative for pallor and wound.  Neurological: Positive for headaches. Negative for dizziness.     Physical Exam Updated Vital Signs BP 109/67   Pulse 77   Temp 97.7 F (36.5 C) (Oral)   Resp 17   SpO2 98%   Physical Exam  Constitutional: She is oriented to person, place, and time. She appears well-developed and well-nourished. No distress.  HENT:  Head: Normocephalic and atraumatic.  Eyes: Pupils are equal, round, and reactive to light. EOM are normal.  Neck: Normal range of motion. Neck supple.  Cardiovascular: Normal rate and regular rhythm.  Exam reveals no gallop and no friction rub.   No murmur heard. Pulmonary/Chest: Effort normal. She has no wheezes. She has no rales.  Abdominal: Soft. She exhibits no distension and no mass. There is no tenderness. There is no guarding.  Musculoskeletal: She exhibits no edema or tenderness.  Neurological: She is alert and oriented to person, place, and time.  Skin: Skin is warm and dry. She is not diaphoretic.  Psychiatric: She has a normal mood and affect. Her behavior is normal.  Nursing note and vitals reviewed.    ED Treatments / Results  Labs (all labs ordered are listed, but only abnormal results are displayed) Labs Reviewed  BASIC METABOLIC PANEL - Abnormal; Notable for the following:       Result Value   Glucose, Bld 109 (*)    All other components within normal limits  CBC  I-STAT TROPONIN, ED    EKG  EKG Interpretation  Date/Time:  Saturday April 11 2017 00:00:52 EDT Ventricular Rate:  78 PR Interval:  174 QRS Duration: 80 QT Interval:  412 QTC Calculation: 469 R Axis:   38 Text Interpretation:   Normal sinus rhythm Cannot rule out Anterior infarct , age undetermined Abnormal ECG No significant change since last tracing Confirmed by Deno Etienne [phone]) on 04/11/2017 4:27:52 AM       Radiology Dg Chest 2 View  Result Date: 04/11/2017 CLINICAL DATA:  Sudden onset of chest pain. EXAM: CHEST  2 VIEW COMPARISON:  11/30/2012 FINDINGS: The cardiomediastinal contours are normal. The lungs are clear. Pulmonary vasculature is normal. No consolidation, pleural effusion, or pneumothorax. No acute osseous abnormalities are seen. IMPRESSION: No acute pulmonary process. Electronically Signed   By: Jeb Levering M.D.   On: 04/11/2017 00:42    Procedures Procedures (including critical care time)  Medications Ordered in ED Medications - No data to display   Initial Impression / Assessment and Plan / ED Course  I have reviewed the triage  vital signs and the nursing notes.  Pertinent labs & imaging results that were available during my care of the patient were reviewed by me and considered in my medical decision making (see chart for details).     41 yo F with a chief complaint of chest pain and a headache. Both of these have resolved. Lasted for only about 20 minutes. No findings on exam. I think most likely this was a reflux event when she lay back to sleep. Will have her start Zantac. EKG chest x-ray troponin negative. Discharge home.  5:19 AM:  I have discussed the diagnosis/risks/treatment options with the patient and family and believe the pt to be eligible for discharge home to follow-up with PCP. We also discussed returning to the ED immediately if new or worsening sx occur. We discussed the sx which are most concerning (e.g., sudden worsening pain, fever, inability to tolerate by mouth ) that necessitate immediate return. Medications administered to the patient during their visit and any new prescriptions provided to the patient are listed below.  Medications given during this  visit Medications - No data to display   The patient appears reasonably screen and/or stabilized for discharge and I doubt any other medical condition or other [facility] requiring further screening, evaluation, or treatment in the ED at this time prior to discharge.    Final Clinical Impressions(s) / ED Diagnoses   Final diagnoses:  Atypical chest pain  Bad headache    New Prescriptions New Prescriptions   No medications on file     Deno Etienne, DO 04/11/17 0045

## 2017-04-11 NOTE — Discharge Instructions (Signed)
Try zantac 150mg twice a day.  ° ° °

## 2017-04-11 NOTE — ED Triage Notes (Signed)
Pt presents to ED for assessment of sudden onset CP after taking seroquel and laying down to sleep.  Pt states pain was radiating up her neck and in to her head.  Hx of aneurysm and migraines.  Denies CP at this time, continues to endorse HA.  Pt styates headache is unlike any before.  No neuro deficits noted.  Pt took 324 ASA before EMS arrival.  20 in [place].

## 2017-12-28 ENCOUNTER — Other Ambulatory Visit (HOSPITAL_COMMUNITY): Payer: Self-pay | Admitting: Interventional Radiology

## 2017-12-28 DIAGNOSIS — I671 Cerebral aneurysm, nonruptured: Secondary | ICD-10-CM

## 2018-01-04 ENCOUNTER — Ambulatory Visit (HOSPITAL_COMMUNITY)
Admission: RE | Admit: 2018-01-04 | Discharge: 2018-01-04 | Disposition: A | Discharge status: Home / Self Care | Payer: BC Managed Care – PPO | Source: Ambulatory Visit | Attending: Interventional Radiology | Admitting: Interventional Radiology

## 2018-01-04 DIAGNOSIS — I671 Cerebral aneurysm, nonruptured: Secondary | ICD-10-CM | POA: Diagnosis present

## 2018-01-04 MED ORDER — GADOBENATE DIMEGLUMINE 529 MG/ML IV SOLN
19.0000 mL | Freq: Once | INTRAVENOUS | Status: AC
  Administered 2018-01-04: 19 mL via INTRAVENOUS

## 2018-01-28 ENCOUNTER — Telehealth (HOSPITAL_COMMUNITY): Payer: Self-pay

## 2018-01-28 NOTE — Telephone Encounter (Signed)
Pt agreed to f/u in 1 year with mri/mra. AW  

## 2018-11-11 ENCOUNTER — Telehealth (HOSPITAL_COMMUNITY): Payer: Self-pay

## 2018-11-11 NOTE — Telephone Encounter (Signed)
Pt called with sx of new headaches. She stated that she was going to see her neurologist but just wanted to let Dr. Estanislado Pandy know. She stated that her neurologist put her on Prednisone and that her headaches have calmed down a lot. I told her if her headaches return, give [place] a call. I have given this information to our [place]. She agrees with this plan. AW

## 2019-02-21 ENCOUNTER — Other Ambulatory Visit (HOSPITAL_COMMUNITY): Payer: Self-pay | Admitting: Interventional Radiology

## 2019-02-21 DIAGNOSIS — I729 Aneurysm of unspecified site: Secondary | ICD-10-CM

## 2019-03-09 ENCOUNTER — Ambulatory Visit (HOSPITAL_COMMUNITY)
Admission: RE | Admit: 2019-03-09 | Discharge: 2019-03-09 | Disposition: A | Discharge status: Home / Self Care | Payer: BC Managed Care – PPO | Source: Ambulatory Visit | Attending: Interventional Radiology | Admitting: Interventional Radiology

## 2019-03-09 ENCOUNTER — Ambulatory Visit (HOSPITAL_COMMUNITY): Admission: RE | Admit: 2019-03-09 | Payer: BC Managed Care – PPO | Source: Ambulatory Visit

## 2019-03-09 ENCOUNTER — Other Ambulatory Visit: Payer: Self-pay

## 2019-03-09 DIAGNOSIS — I729 Aneurysm of unspecified site: Secondary | ICD-10-CM | POA: Diagnosis not present

## 2019-03-14 ENCOUNTER — Telehealth (HOSPITAL_COMMUNITY): Payer: Self-pay

## 2019-03-14 NOTE — Telephone Encounter (Signed)
Pt agreed to f/u in 1 year with mri/mra. AW  

## 2019-03-14 NOTE — Telephone Encounter (Signed)
Called pt regarding recent mri, no answer, left vm. AW 

## 2020-02-20 ENCOUNTER — Other Ambulatory Visit (HOSPITAL_COMMUNITY): Payer: Self-pay | Admitting: Interventional Radiology

## 2020-02-20 DIAGNOSIS — I671 Cerebral aneurysm, nonruptured: Secondary | ICD-10-CM

## 2020-03-14 ENCOUNTER — Other Ambulatory Visit: Payer: Self-pay

## 2020-03-14 ENCOUNTER — Ambulatory Visit (HOSPITAL_COMMUNITY)
Admission: RE | Admit: 2020-03-14 | Discharge: 2020-03-14 | Disposition: A | Discharge status: Home / Self Care | Payer: BC Managed Care – PPO | Source: Ambulatory Visit | Attending: Interventional Radiology | Admitting: Interventional Radiology

## 2020-03-14 ENCOUNTER — Ambulatory Visit (HOSPITAL_COMMUNITY): Payer: BC Managed Care – PPO

## 2020-03-14 ENCOUNTER — Other Ambulatory Visit (HOSPITAL_COMMUNITY): Payer: Self-pay | Admitting: Student

## 2020-03-14 DIAGNOSIS — I671 Cerebral aneurysm, nonruptured: Secondary | ICD-10-CM | POA: Diagnosis not present

## 2020-03-14 MED ORDER — DIAZEPAM 2 MG PO TABS: ORAL_TABLET | ORAL | 0 refills | Status: AC

## 2020-03-19 ENCOUNTER — Telehealth (HOSPITAL_COMMUNITY): Payer: Self-pay

## 2020-03-19 NOTE — Telephone Encounter (Signed)
Pt agreed to f/u in 1 year with mri/mra. AW  

## 2020-08-16 ENCOUNTER — Ambulatory Visit (HOSPITAL_COMMUNITY)
Admission: EM | Admit: 2020-08-16 | Discharge: 2020-08-16 | Disposition: A | Discharge status: Home / Self Care | Payer: BC Managed Care – PPO | Attending: Student | Admitting: Student

## 2020-08-16 ENCOUNTER — Other Ambulatory Visit: Payer: Self-pay

## 2020-08-16 ENCOUNTER — Encounter (HOSPITAL_COMMUNITY): Payer: Self-pay | Admitting: Emergency Medicine

## 2020-08-16 DIAGNOSIS — H60391 Other infective otitis externa, right ear: Secondary | ICD-10-CM | POA: Diagnosis not present

## 2020-08-16 DIAGNOSIS — U071 COVID-19: Secondary | ICD-10-CM | POA: Diagnosis not present

## 2020-08-16 DIAGNOSIS — J069 Acute upper respiratory infection, unspecified: Secondary | ICD-10-CM

## 2020-08-16 LAB — RESP PANEL BY RT-PCR (FLU A&B, COVID) ARPGX2
Influenza A by PCR: NEGATIVE
Influenza B by PCR: NEGATIVE
SARS Coronavirus 2 by RT PCR: POSITIVE — AB

## 2020-08-16 MED ORDER — PROMETHAZINE-DM 6.25-15 MG/5ML PO SYRP: 5.0000 mL | ORAL_SOLUTION | Freq: Four times a day (QID) | ORAL | 0 refills | Status: AC | PRN

## 2020-08-16 MED ORDER — AMOXICILLIN-POT CLAVULANATE 875-125 MG PO TABS: 1.0000 | ORAL_TABLET | Freq: Two times a day (BID) | ORAL | 0 refills | Status: AC

## 2020-08-16 NOTE — Discharge Instructions (Addendum)
Start the antibiotic (Augmentin). You can take this with food. You can also use the cough syrup (Promethazine DM) to help with your cough and congestion symptoms. You can try mucinex if you prefer this. Make sure to rest and drink plenty of fluids.   We'll call you if the result of your covid or influenza test is positive.  Seek additional medical attention if you develop chest pain, shortness of breath, the worst headache of your life, new/rising fevers, worsening of symptoms despite treatment, etc.

## 2020-08-16 NOTE — ED Provider Notes (Addendum)
[facility]    CSN: [id number] Arrival date & time: 08/16/20  1011      History   Chief Complaint Chief Complaint  Patient presents with  . Nasal Congestion  . Cough  . Headache    HPI Katherine [name] is a 44 y.o. female presenting with headache, cough, and congestion. History of aneurysm, anxiety, migraine. Today she endorses 1 day of symptoms. Endorses 7/10 headache behind her eyes, as well as productive cough and nasal congestion. endorsees photophobia, decreased appetite. Denies worse headache of life, denies LOC, denies n/v/d, denies weakness or sensation changes in arms/legs, denies thunderclap headache. Denies chest pain, denies shortness of breath. Denies fevers/chills, n/v/d, shortness of breath, chest pain, teeth pain, loss of taste/smell, swollen lymph nodes, ear pain. Fully vaccinated for covid-19.   HPI  Past Medical History:  Diagnosis Date  . 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 Anxiety   . Cerebral aneurysm   . Hematuria    & probable OAB  . Migraine     There are no problems to display for this patient.   Past Surgical History:  Procedure Laterality Date  . APPENDECTOMY    . BRAIN SURGERY    . CHOLECYSTECTOMY      OB History   No obstetric history on file.      Home Medications    Prior to Admission medications   Medication Sig Start Date End Date Taking? Authorizing Provider  amoxicillin-clavulanate (AUGMENTIN) 875-125 MG tablet Take 1 tablet by mouth every 12 (twelve) hours. 08/16/20   Hazel Sams, PA-C  ASPIRIN 81 PO Take by mouth.    [provider]  beclomethasone (QVAR) 40 MCG/ACT inhaler 1 puff    [provider]  diazepam (VALIUM) 2 MG tablet Take one tablet one hour prior to MRI 03/14/2020. Take one additional tablet thirty minutes later if desired effects not achieved. Do not drive self while taking this medication. 03/14/20   Louk, Bea Graff, PA-C  Elagolix Sodium 150 MG TABS 1 tablet     [provider]  eletriptan (RELPAX) 40 MG tablet Take 40 mg by mouth as needed for migraine or headache. May repeat in 2 hours if headache persists or recurs.    [provider]  Fexofenadine HCl (ALLEGRA PO) Take by mouth.    [provider]  lamoTRIgine (LAMICTAL) 150 MG tablet Take 150 mg by mouth 2 (two) times daily.    [provider]  lamoTRIgine (LAMICTAL) 150 MG tablet Take by mouth.    [provider]  leuprolide (LUPRON) 11.25 MG injection Inject 11.25 mg into the muscle every 3 (three) months.    [provider]  Melatonin 3 MG TABS Take 3 mg by mouth at bedtime.     [provider]  omeprazole (PRILOSEC) 20 MG capsule Take 20 mg by mouth at bedtime.    [provider]  ondansetron (ZOFRAN) 4 MG tablet Take 4 mg by mouth every 6 (six) hours as needed for nausea or vomiting.    [provider]  OVER THE COUNTER MEDICATION Apply 1 application topically 4 (four) times a week. Essential oils    [provider]  promethazine-dextromethorphan (PROMETHAZINE-DM) 6.25-15 MG/5ML syrup Take 5 mLs by mouth 4 (four) times daily as needed for cough. 08/16/20   Hazel Sams, PA-C  QUEtiapine (SEROQUEL XR) 300 MG 24 hr tablet Take 300 mg by mouth daily.    [provider]  QUEtiapine (SEROQUEL)  100 MG tablet Take 100 mg by mouth at bedtime.    [provider]  SUMAtriptan (IMITREX) 100 MG tablet Take 100 mg by mouth every 2 (two) hours as needed for migraine or headache. May repeat in 2 hours if headache persists or recurs.    [provider]  zonisamide (ZONEGRAN) 100 MG capsule Take 600 mg by mouth at bedtime.     [provider]    Family History History reviewed. No pertinent family history.  Social History Social History   Tobacco Use  . Smoking status: Never Smoker  . Smokeless tobacco: Never Used  Substance Use Topics  . Alcohol use: No  . Drug use: No      Allergies   Ultram [tramadol hcl], Ivp dye [iodinated diagnostic agents], Percocet [oxycodone-acetaminophen], Pyridium plus [phenazopyridine-butabarb-hyosc], Zomig [zolmitriptan], Codeine, Ditropan [oxybutynin chloride], and Hydrocodone   Review of Systems Review of Systems  HENT: Positive for congestion, sinus pressure and sore throat.   Respiratory: Positive for cough.   Neurological: Positive for headaches.  All other systems reviewed and are negative.    Physical Exam Triage Vital Signs ED Triage Vitals  Enc Vitals Group     BP 08/16/20 1114 131/88     Pulse Rate 08/16/20 1114 80     Resp 08/16/20 1114 20     Temp 08/16/20 1114 97.9 F (36.6 C)     Temp src --      SpO2 08/16/20 1114 98 %     Weight --      Height --      Head Circumference --      Peak Flow --      Pain Score 08/16/20 1116 7     Pain Loc --      Pain Edu? --      Excl. in [place]? --    No data found.  Updated Vital Signs BP 131/88 (BP Location: Right Leg)   Pulse 80   Temp 97.9 F (36.6 C)   Resp 20   LMP 08/02/2020 (Approximate)   SpO2 98%   Visual Acuity Right Eye Distance:   Left Eye Distance:   Bilateral Distance:    Right Eye Near:   Left Eye Near:    Bilateral Near:     Physical Exam Vitals reviewed.  Constitutional:      General: She is not in acute distress.    Appearance: Normal appearance. She is ill-appearing.  HENT:     Head: Normocephalic and atraumatic.     Right Ear: Hearing, ear canal and external ear normal. No swelling or tenderness. There is no impacted cerumen. No mastoid tenderness. Tympanic membrane is erythematous and bulging. Tympanic membrane is not perforated or retracted.     Left Ear: Hearing, tympanic membrane, ear canal and external ear normal. No swelling or tenderness. There is no impacted cerumen. No mastoid tenderness. Tympanic membrane is not perforated, erythematous, retracted or bulging.     Nose:     Right Sinus: Maxillary sinus tenderness  present. No frontal sinus tenderness.     Left Sinus: Maxillary sinus tenderness present. No frontal sinus tenderness.     Mouth/Throat:     Mouth: Mucous membranes are moist.     Pharynx: Uvula midline. No oropharyngeal exudate or posterior oropharyngeal erythema.     Tonsils: No tonsillar exudate.  Eyes:     Extraocular Movements: Extraocular movements intact.     Pupils: Pupils are equal, round, and reactive to light.  Cardiovascular:  Rate and Rhythm: Normal rate and regular rhythm.     Heart sounds: Normal heart sounds.  Pulmonary:     Breath sounds: Normal breath sounds and air entry.  Abdominal:     Palpations: Abdomen is soft.     Tenderness: There is no abdominal tenderness. There is no right CVA tenderness, left CVA tenderness, guarding or rebound. Negative signs include Murphy's sign, Rovsing's sign and McBurney's sign.  Lymphadenopathy:     Cervical: No cervical adenopathy.  Neurological:     General: No focal deficit present.     Mental Status: She is alert and oriented to person, place, and time.     Comments: CN 2-12 grossly intact.   Psychiatric:        Attention and Perception: Attention and perception normal.        Mood and Affect: Mood and affect normal.        Behavior: Behavior normal. Behavior is cooperative.      UC Treatments / Results  Labs (all labs ordered are listed, but only abnormal results are displayed) Labs Reviewed  RESP PANEL BY RT-PCR (FLU A&B, COVID) [id number]    EKG   Radiology No results found.  Procedures Procedures (including critical care time)  Medications Ordered in UC Medications - No data to display  Initial Impression / Assessment and Plan / UC Course  I have reviewed the triage vital signs and the nursing notes.  Pertinent labs & imaging results that were available during my care of the patient were reviewed by me and considered in my medical decision making (see chart for details).     Augmentin as below for  AOM. Script for promethazine DM sent; or she can use Mucinex. She understands that this medication could cause drowiness.   Covid and influenza tests sent today. Patient is fully vaccinated for covid-19. Isolation precautions per CDC guidelines until negative result. Symptomatic relief with OTC Mucinex, Nyquil, etc. Return precautions- new/worsening fevers/chills, shortness of breath, chest pain, abd pain, worsening of headache, worst headache of life, etc.    Final Clinical Impressions(s) / UC Diagnoses   Final diagnoses:  Other infective acute otitis externa of right ear  Acute upper respiratory infection     Discharge Instructions     Start the antibiotic (Augmentin). You can take this with food. You can also use the cough syrup (Promethazine DM) to help with your cough and congestion symptoms. You can try mucinex if you prefer this. Make sure to rest and drink plenty of fluids.   We'll call you if the result of your covid or influenza test is positive.  Seek additional medical attention if you develop chest pain, shortness of breath, the worst headache of your life, new/rising fevers, worsening of symptoms despite treatment, etc.     ED Prescriptions    Medication Sig Dispense Auth. Provider   promethazine-dextromethorphan (PROMETHAZINE-DM) 6.25-15 MG/5ML syrup Take 5 mLs by mouth 4 (four) times daily as needed for cough. 118 mL Marin Roberts E, PA-C   amoxicillin-clavulanate (AUGMENTIN) 875-125 MG tablet Take 1 tablet by mouth every 12 (twelve) hours. 14 tablet Hazel Sams, PA-C     PDMP not reviewed this encounter.   Hazel Sams, PA-C 08/16/20 1151    Hazel Sams, PA-C 08/16/20 1157

## 2020-08-16 NOTE — ED Triage Notes (Signed)
Pt c/o of headache, congestion and cough.

## 2021-01-17 ENCOUNTER — Other Ambulatory Visit: Payer: Self-pay

## 2021-01-17 ENCOUNTER — Ambulatory Visit (INDEPENDENT_AMBULATORY_CARE_PROVIDER_SITE_OTHER): Payer: BC Managed Care – PPO | Admitting: Family Medicine

## 2021-01-17 DIAGNOSIS — F5081 Binge eating disorder: Secondary | ICD-10-CM | POA: Diagnosis not present

## 2021-01-17 NOTE — Patient Instructions (Addendum)
Goals:  1. Exercise (anyy kind) at least 10 minutes 3 X wk.       Document the # of minutes.    2. For both lunch and dinner, whenever possible, include twice the volume of vegetables as either protein or starch foods.      Consider what starchy foods you might incorporate to meals (in moderation).      Expand the variety of vegetables you get as much as you can.  Remember to try roasting vegetables.    For the next two weeks:   At the end of the day, review food choice made, and answer these Qs:  - What were especially good choices? - Were there foods you ate, but didn't really want/need?   - Why did you choose to eat those foods? - What drove your food choices today?  - Pay attn to feelings associated to food choices and to the process of eating.   F/U in-office appt on June 23 at 2:30 PM.

## 2021-01-17 NOTE — Progress Notes (Signed)
Medical Nutrition Therapy PCP Milagros Evener, [place]  Therapist Lilyan Punt  Patient has completed 2nd dose of COVID-19 vaccine. Appt start time: 1530 end time: 1630 (1 hour)  Relevant history/background: Katherine [name] was referred by therapist Lilyan Punt for MNT related to weight management and disordered eating.  She was seen for MNT in 2017 and 2018.  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  She grew up with a highly critical mother, who secured weight loss pills for [name] her senior yr in high school.  Signif med hx includes 2011 surgery for brain aneurisms, which followed her husband's suicide by just a couple of weeks.  Wisdom goes through phases of binge eating. She tried the [place] program for 8 wks last year, which did not help her lose wt, and made her miserable.  She tried it again in April 2022.  She lost nearly 10 lb, but was again always hungry and miserable.    Assessment:  Katherine [name] has been struggling with phases of binge eating.  She is especially vulnerable in the afternoon prior to dinner.    Usual eating pattern: 3 meals and 2-4 snacks per day.  Ends up binge-eating between end of school and dinner.   Frequent foods and beverages: water, coffee in a Premier protein shake; egg whites, pro shakes, chx, fruit, Dannon Grk vanilla yogurt (80 kcal).   Avoided foods: none.   Usual physical activity: none other than walking the dog, ~10 min 2 X day. Sleep: Estimates ~8 hrs/night. Takes 3 mg melatonin.    24-hr recall: (Up at 5:30 AM) B (6:15 AM)-   Coffee in protein shake (30 g pro, ? kcal)  B (7 AM)-   2 "egg bites," 2 small sausage patties, water   L (11:30 PM)-  5 oz Grk yog (80 kcal), 1.5 c mixed fruit, 1 Premier pro shake Snk (2:30)-  1 small bag Skinny Pop  Snk (3 PM)-  1 Chewy granola bar (low-Na, low-sugar), handful pistachios,  D (4:30 PM)-  12 Chick-Fil-A grilled  nuggets, side salad, 2-3 tbsp drsng, water Snk (6:45)-  1-2 c ice cream Typical day? Yes.   Takeout/rest foods ~3 X wk.  Doesn't usually have pro shake for lunch.    Nutritional Diagnosis:  NB-1.5 Disordered eating pattern As related to binge eating.  As evidenced by frequent episodes of snacking in the afternoon with sense of loss of control.  Handouts given during visit include:  After-Visit Summary (AVS)  Goals Sheet  Demonstrated degree of understanding via:  Teach Back   Monitoring/Evaluation:  Dietary intake, exercise, and body weight in 4 week(s).

## 2021-01-18 ENCOUNTER — Other Ambulatory Visit: Payer: Self-pay | Admitting: Obstetrics and Gynecology

## 2021-01-18 DIAGNOSIS — R928 Other abnormal and inconclusive findings on diagnostic imaging of breast: Secondary | ICD-10-CM

## 2021-02-11 ENCOUNTER — Other Ambulatory Visit: Payer: Self-pay

## 2021-02-11 ENCOUNTER — Ambulatory Visit
Admission: RE | Admit: 2021-02-11 | Discharge: 2021-02-11 | Disposition: A | Discharge status: Home / Self Care | Payer: BC Managed Care – PPO | Source: Ambulatory Visit | Attending: Obstetrics and Gynecology | Admitting: Obstetrics and Gynecology

## 2021-02-11 DIAGNOSIS — R928 Other abnormal and inconclusive findings on diagnostic imaging of breast: Secondary | ICD-10-CM

## 2021-02-14 ENCOUNTER — Other Ambulatory Visit: Payer: Self-pay

## 2021-02-14 ENCOUNTER — Ambulatory Visit (INDEPENDENT_AMBULATORY_CARE_PROVIDER_SITE_OTHER): Payer: BC Managed Care – PPO | Admitting: Family Medicine

## 2021-02-14 DIAGNOSIS — F5081 Binge eating disorder: Secondary | ICD-10-CM | POA: Diagnosis not present

## 2021-02-14 NOTE — Patient Instructions (Signed)
The next time you feel shaky (like last night), check your pulse, and record the number if it seems abnormal.  Pay attention to the regularity of the rhythm.    At times you don't feel like eating, try to at least have something with protein as well as a fruit and/or vegetables.    Exercise (any kind) at least 10 minutes 3 X wk.         Document the # of minutes.  This can be approximate.    Eat breakfast, lunch, & dinner, conforming to recommended criteria (breakfast to include protein & carb & >300 kcal; lunch and dinner to include veg's & protein).  Protein alternatives to meat and fish: nuts&seeds or nut butter (be cautious with portion size) yogurt, cheese or string cheese, protein shake or bar.  Post this list of protein foods on the inside of a kitchen cupboard.   TRACKING:   Record # min of exercise; record B, L, and D for each meal that meets goal.     Write down your weekly totals/averages [# of meals/7 days] at the far right of each week.     Make sure you keep your Goals Sheet in a place where you see it.    - When you are struggling, ask yourself these three "decoding" questions:    1. What am I feeling right now?    2. What do I want to feel?    3. What do I truly need right now?    4. What can I do to meet that (those) need(s)? Use the Feelings and Needs lists, and WRITE your answers.   You are looking for feelings, not thoughts.  Bring your responses to your follow-up appt.    F/U in-office appt on Thursday, July 21 at 1:30 PM.

## 2021-02-14 NOTE — Progress Notes (Signed)
Medical Nutrition Therapy PCP Milagros Evener, [place]  Therapist Lilyan Punt  Patient has completed 2nd dose of COVID-19 vaccine. Appt start time: 1430 end time: 1530 (1 hour)  Relevant history/background: Lynnea was referred by therapist Lilyan Punt for MNT related to weight management and disordered eating.  She was seen for MNT in 2017 and 2018.  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  She grew up with a highly critical mother, who secured weight loss pills for Asha her senior yr in high school.  Signif med hx includes 2011 surgery for brain aneurisms, which followed her husband's suicide by just a couple of weeks.  Karolyn goes through phases of binge eating. She tried the [place] program for 8 wks last year, which did not help her lose wt, and made her miserable.  She tried it again in April 2022.  She lost nearly 10 lb, but was again always hungry and miserable.    Assessment:  Henry has been increasing activity, and has been making an effort to include vegetables at lunches and dinner.  She documented progress on most days, but forgot her form when at the beach for a week.  She did not remember to review drivers of food choices during the day.   Usual eating pattern: 3 meals and 2-4 snacks per day.   Loss of control eating: Has continued to feel compelled to eat late-day most days.    Recent physical activity: Has walked sporadically, and has done water exercise at the pool on some days.  Also walking the dog, ~10 min 2 X day. 24-hr recall:  (Up at 7:30 AM) B (7:30 AM)-  1 c coffee, alm milk flvrd creamer B (9 AM)-  11 oz Premier Pro shake Snk ( AM)-  --- L (11:30 AM)-  1 c ramen noodles, 1/2 cucumber, water - Went to pool -  Snk (1-4 PM)-  Hourly snacks: 3-4 chunks watermelon&cantaloupe, 1 oz Skinny Pop, 2 Belvita crackers, water D (5:15 PM)-  12 Chick-Fil-A grilled  nuggets, 2 lettuce salads, 1 pkt avocado lime drsng, water - Started feeling shakiness in chest and "almost light-headed")  ~6:30 PM -  Snk (6:45)-  1 c ice crm, water Typical day? Yes.    Nutritional Diagnosis: Slight progress on NB-1.5 Disordered eating pattern as related to binge eating as evidenced by continued, but fewer snacking episodes with sense of loss of control .   Handouts given during visit include: After-Visit Summary (AVS) Revised Goals Sheet  Demonstrated degree of understanding via:  Teach Back   Monitoring/Evaluation:  Dietary intake, exercise, and body weight in 4 week(s).

## 2021-03-13 ENCOUNTER — Telehealth (HOSPITAL_COMMUNITY): Payer: Self-pay

## 2021-03-13 NOTE — Telephone Encounter (Signed)
Called to schedule mri/mra, no answer, left vm. AW  

## 2021-03-14 ENCOUNTER — Ambulatory Visit (INDEPENDENT_AMBULATORY_CARE_PROVIDER_SITE_OTHER): Payer: BC Managed Care – PPO | Admitting: Family Medicine

## 2021-03-14 ENCOUNTER — Other Ambulatory Visit: Payer: Self-pay

## 2021-03-14 DIAGNOSIS — Z713 Dietary counseling and surveillance: Secondary | ICD-10-CM | POA: Diagnosis not present

## 2021-03-14 DIAGNOSIS — F5081 Binge eating disorder: Secondary | ICD-10-CM

## 2021-03-14 NOTE — Progress Notes (Signed)
Telehealth Medical Nutrition Therapy Encounter PCP Milagros Evener, [facility]  Therapist Lilyan Punt  Patient has completed 2nd dose of COVID-19 vaccine.  I connected with Katherine [name] (MRN [id number]) on 03/14/2021 by MyChart video-enabled, HIPAA-compliant telemedicine application, verified that I was speaking with the correct person using two identifiers, and that the patient was in a private environment conducive to confidentiality.  The patient agreed to proceed.  Persons participating in visit were patient and provider (registered dietitian) Kennith Center, PhD, RD, LDN, CEDRD.  Provider was located at [facility] during this telehealth encounter; patient was at home.  Appt start time: 1330 end time: 1430 (1 hour)  Relevant history/background: Katherine [name] was referred by therapist Lilyan Punt for MNT related to weight management and disordered eating.  She was seen for MNT in 2017 and 2018.  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  She grew up with a highly critical mother, who secured weight loss pills for Katherine [name] her senior yr in high school.  Signif med hx includes 2011 surgery for brain aneurisms, which followed her husband's suicide by just a couple of weeks.  [name] goes through phases of binge eating.  She tried the [place] program for 8 wks last year, which did not help her lose wt, and made her miserable.  She tried it again in April 2022.  She lost nearly 10 lb, but was again always hungry and miserable.    Assessment:  Katherine [name]'s brother tried to commit suicide a couple weeks ago, which is especially distressing as he [place] of [place], and she can't be with him at this time.  She ate one meal a day for the first 3 days after this.  She has been journaling some, using the decoding questions suggested.  She also has been tracking progress on her behavioral goals,  using the Goals Sheet.  She feels pretty good about her progress with respect to her goals, and she has been much less inclined to overeat during the afternoon.   Usual eating pattern: 3 meals and 2-4 snacks per day.  Lunch is most challenging meal to get.  Not always hungry mid-day, but then gets especially hungry late-afternoon.   Loss of control eating: Has had the thought of binge eating at times, but has managed to not do so with exception of one episode.  On that day, she had not eaten breakfast or lunch.   Recent physical activity: Has walked or done some other exercise 3-4 X week  Also walking the dog, ~10 min 2 X day. 24-hr recall:  (Up at 8 AM; coffee with 2 tbsp creamer) B (8:30 AM)-  1 Orgain protein shake, 1 banana Snk ( AM)-  --- L (12:30 PM)-  2 [place] meatballs, 8 spears asparagus, water Snk (2:30)-  1 oz Skinny Pop popcorn, water D (4:30 PM)-  2 chx tenders, 1 c grn beans, 1/2 c corn, roll, 1 tsp butter, water Snk (7:30)-  Vitamin water, 1 slc birthday cake Typical day? Yes.   Except doesn't usually get cake (served at a meeting she attended).    Nutritional Diagnosis: Noted progress on NB-1.5 Disordered eating pattern as related to binge eating as evidenced by far fewer snacking episodes with sense of loss of control.  Importantly, [name] has not responded to the recent stressful news by inappropriate eating.    Intervention: Completed diet and exercise history,  and confirmed behavioral goals.   Monitoring/Evaluation:  Weight check in one week.  Dietary intake, exercise, and body weight in 4 week(s).

## 2021-03-14 NOTE — Patient Instructions (Addendum)
-   Besides considering numbers on the scale, pay attention to any changes you notice, e.g., how is your energy level? Any difference in your exercise tolerance, your sleep, your mood?    - Focus on getting an adequate lunch.  It doesn't have to be a big meal, but you want to adapt to getting enough at this meal so you won't be over-hungry later in the afternoon.  When we eat a meal, we usually choose whole, real foods, and the eating time has boundaries.  When we snack, we also can get whole, real food in the form of fruit or yogurt, for example, but snacking also presents many opportunities for packaged snack foods or sweets, and there is no defined timeframe around snacking.  So getting hungry at a non-mealtime increases  the likelihood of overeating or of choosing foods you might otherwise not eat.    Goals:  Exercise (any kind) at least 15 minutes 3 X wk.         Document the # of minutes.  This can be approximate.    Eat breakfast, lunch, & dinner, conforming to recommended criteria (breakfast to include protein & carb & >300 kcal; lunch and dinner to include veg's & protein).  Protein alternatives to meat and fish: nuts&seeds or nut butter (be cautious with portion size) yogurt, cheese or string cheese, protein shake or bar.  Post this list of protein foods on the inside of a kitchen cupboard.   TRACKING:    Record # min of exercise; record B, L, and D for each meal that meets goal.                           Write down your weekly totals/averages [# of meals/7 days] at the far right of each week.                           Make sure you keep your Goals Sheet in a place where you see it.   3. Include a protein food at lunch.   - When you are struggling, ask yourself these three "decoding" questions:    1. What am I feeling right now?    2. What do I want to feel?    3. What do I truly need right now?    4. What can I do to meet that (those) need(s)? Use the Feelings and Needs lists, and WRITE  your answers.   You are looking for feelings, not thoughts.  Bring your responses to your follow-up appt.    F/U weight check on Tuesday, Aug 2 at 4:30 PM and in-office appt on Monday, Aug 8 at 2:30 PM.

## 2021-04-01 ENCOUNTER — Ambulatory Visit: Payer: BC Managed Care – PPO | Admitting: Family Medicine

## 2021-04-08 ENCOUNTER — Ambulatory Visit (INDEPENDENT_AMBULATORY_CARE_PROVIDER_SITE_OTHER): Payer: BC Managed Care – PPO | Admitting: Family Medicine

## 2021-04-08 ENCOUNTER — Other Ambulatory Visit: Payer: Self-pay

## 2021-04-08 DIAGNOSIS — F5081 Binge eating disorder: Secondary | ICD-10-CM

## 2021-04-08 NOTE — Progress Notes (Signed)
Telehealth Medical Nutrition Therapy PCP Milagros Evener, [place]  Therapist Lilyan Punt  Patient has completed 2nd dose of COVID-19 vaccine. Appt start time: 1330 end time: 1430 (1 hour)  Relevant history/background: Katherine [name] was referred by therapist Lilyan Punt for MNT related to weight management and disordered eating.  She was seen for MNT in 2017 and 2018.  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  She grew up with a highly critical mother, who secured weight loss pills for [name] her senior yr in high school.  Signif med hx includes 2011 surgery for brain aneurisms, which followed her husband's suicide by just a couple of weeks.  [name] goes through phases of binge eating.  She tried the [place] program for 8 wks last year, which did not help her lose wt, and made her miserable.  She tried it again in April 2022.  She lost nearly 10 lb, but was again always hungry and miserable.    Assessment:  [name] was sick last week with a respiratory infection, but is back to pretty normal eating behaviors.  She and her daughters were at the beach for a week in late July, where she walked most days.  She has consistently documented her progress on goals; has meet her exercise goal, but still struggles with getting 3 real meals/day, especially lunch.  [name] is pleased that she has not had problems with binge eating recently, but  is wary of when her stress levels go up after school starts.   Usual eating pattern: 2-3 meals and ~2 snacks per day.   Loss of control eating: Denies.   Recent physical activity: Walked at least 3-4 X week other than when she was sick.  Also walking the dog, ~10 min 2 X day. 24-hr recall:  (Up at 8:30 AM) B (9:30 AM)-  1 c coffee, 2 tsp flavored creamer, 11 oz Orgain protein shake, 1/2 c strawberries Snk ( AM)-  water L (1 PM)-  1 c shrimp, 1 c zucchini, 15 corn chips,  1/4 c salsa, water Snk ( PM)-  water D (5:30 PM)-  5 oz 2Good vanilla Grk yogurt  Snk (8 PM)-  100 kcal [employer] Typical day? No.  More typical is dinner of protein, starch, and veg's, and went out to lunch yesterday, which is unusual.  Had migraine H/A that started ~4 PM.    Nutritional Diagnosis: Some progress noted on NB-1.5 Disordered eating pattern as related to binge eating as evidenced by no eating episodes with sense of loss of control.    Intervention: Completed diet and exercise history, confirmed behavioral goals, and discussed some ways of continuing to prevent LOC eating.   Monitoring/Evaluation:  Dietary intake, exercise, and body weight in 6 week(s).

## 2021-04-08 NOTE — Patient Instructions (Signed)
What you will pack for lunch tomorrow:  Cucumber slices, yogurt, [id number] Wheat Thins, watermelon.   Real meal vs. Snack:  HOW and when you eat matters also, not just foods chosen.  You are ultimately looking for the most satisfaction from foods you choose.  A meal provides that "stopping point" that is harder to reach with snacking.  A real meal, even if unconventional, is planned, so you see every part of it, and it's easier to know your appropriate portion sizes.    Goals:  Exercise (any kind) at least 15 minutes 3 X wk.  Plan your walk days for the upcoming week ([place]).  Better yet, make your walk days consistent week to week as much as possible!         Determine some ways of distracting yourself when you walk/exercise; make a list of options.        Eat breakfast, lunch, & dinner, conforming to recommended criteria (breakfast to include protein & carb & >300 kcal; lunch and dinner to include veg's & protein).  Protein alternatives to meat and fish: nuts&seeds or nut butter (be cautious with portion size) yogurt, cheese or string cheese, protein shake or bar.  Post this list of protein foods on the inside of a kitchen cupboard.   TRACKING:    Record # min of exercise; record B, L, and D for each meal that meets goal.                           Write down your weekly totals/averages [# of meals/7 days] at the far right of each week.    Ways you can help prevent out-of-control eating, especially for when school starts back:  - Continue to eat breakfast daily.   - Plan and pack lunch for each work day.   - Plan and pack an afternoon snack for work each day.     - Make a list of snack options, e.g., any fresh fruit, string cheese, yogurt, nuts, crackers w/ cheese or PB, bag of Skinny Pop popcorn, protein bar.    Follow-up: In-office appt on Monday, Sept 26 at 3:30 PM.

## 2021-04-25 ENCOUNTER — Ambulatory Visit: Payer: BC Managed Care – PPO | Admitting: Family Medicine

## 2021-05-20 ENCOUNTER — Ambulatory Visit: Payer: BC Managed Care – PPO | Admitting: Family Medicine

## 2021-06-13 ENCOUNTER — Other Ambulatory Visit: Payer: Self-pay

## 2021-06-13 ENCOUNTER — Ambulatory Visit (HOSPITAL_COMMUNITY)
Admission: EM | Admit: 2021-06-13 | Discharge: 2021-06-13 | Disposition: A | Discharge status: Home / Self Care | Payer: BC Managed Care – PPO

## 2021-06-13 ENCOUNTER — Encounter (HOSPITAL_COMMUNITY): Payer: Self-pay | Admitting: Emergency Medicine

## 2021-06-13 DIAGNOSIS — R0981 Nasal congestion: Secondary | ICD-10-CM

## 2021-06-13 DIAGNOSIS — Z8679 Personal history of other diseases of the circulatory system: Secondary | ICD-10-CM | POA: Diagnosis not present

## 2021-06-13 DIAGNOSIS — J069 Acute upper respiratory infection, unspecified: Secondary | ICD-10-CM | POA: Diagnosis not present

## 2021-06-13 LAB — POC INFLUENZA A AND B ANTIGEN (URGENT CARE ONLY)
INFLUENZA A ANTIGEN, POC: NEGATIVE
INFLUENZA B ANTIGEN, POC: NEGATIVE

## 2021-06-13 MED ORDER — IPRATROPIUM BROMIDE 0.03 % NA SOLN: 2.0000 | Freq: Two times a day (BID) | NASAL | 0 refills | Status: AC

## 2021-06-13 NOTE — Discharge Instructions (Addendum)
We will manage this as a viral syndrome. For sore throat or cough try using a honey-based tea. Use 3 teaspoons of honey with juice squeezed from half lemon. Place shaved pieces of ginger into 1/2-1 cup of water and warm over stove top. Then mix the ingredients and repeat every 4 hours as needed. Please take Tylenol 500mg -650mg  once every 6 hours for fevers, aches and pains. Hydrate very well with at least 2 liters (64 ounces) of water. Eat light meals such as soups (chicken and noodles, chicken wild rice, vegetable).  Do not eat any foods that you are allergic to.  Start an antihistamine like Zyrtec for postnasal drainage, sinus congestion.  Use Atrovent nasal spray as well for your sinus congestion/sinus headaches.

## 2021-06-13 NOTE — ED Triage Notes (Signed)
Ticed symptoms Sunday night.  Symptoms worsened yesterday.  Symptoms include cough, head and chest congestion, sore throat that patient contributes to drainage in throat, headache and lack of energy.

## 2021-06-13 NOTE — ED Provider Notes (Signed)
[place]   MRN: [id number] DOB: Aug 29, 1975  Subjective:   Katherine [name] is a 45 y.o. female presenting for 4-day history of acute onset coughing, sinus congestion, right ear pain, chest congestion, throat pain, body aches, decreased energy.  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  Would like to be tested for this.  No COVID exposure.  No weakness, numbness or tingling, symptoms consistent with her history of cerebral aneurysm.  Patient is not a smoker.  No current facility-administered medications for this encounter.  Current Outpatient Medications:    amoxicillin-clavulanate (AUGMENTIN) 875-125 MG tablet, Take 1 tablet by mouth every 12 (twelve) hours., Disp: 14 tablet, Rfl: 0   ASPIRIN 81 PO, Take by mouth., Disp: , Rfl:    beclomethasone (QVAR) 40 MCG/ACT inhaler, 1 puff, Disp: , Rfl:    diazepam (VALIUM) 2 MG tablet, Take one tablet one hour prior to MRI 03/14/2020. Take one additional tablet thirty minutes later if desired effects not achieved. Do not drive self while taking this medication., Disp: 2 tablet, Rfl: 0   Elagolix Sodium 150 MG TABS, 1 tablet, Disp: , Rfl:    eletriptan (RELPAX) 40 MG tablet, Take 40 mg by mouth as needed for migraine or headache. May repeat in 2 hours if headache persists or recurs., Disp: , Rfl:    Fexofenadine HCl (ALLEGRA PO), Take by mouth., Disp: , Rfl:    lamoTRIgine (LAMICTAL) 150 MG tablet, Take 150 mg by mouth 2 (two) times daily., Disp: , Rfl:    lamoTRIgine (LAMICTAL) 150 MG tablet, Take by mouth., Disp: , Rfl:    leuprolide (LUPRON) 11.25 MG injection, Inject 11.25 mg into the muscle every 3 (three) months., Disp: , Rfl:    Melatonin 3 MG TABS, Take 3 mg by mouth at bedtime. , Disp: , Rfl:    omeprazole (PRILOSEC) 20 MG capsule, Take 20 mg by mouth at bedtime., Disp: , Rfl:    ondansetron (ZOFRAN) 4 MG tablet, Take 4 mg by mouth every 6 (six) hours as needed for nausea or vomiting., Disp: , Rfl:     OVER THE COUNTER MEDICATION, Apply 1 application topically 4 (four) times a week. Essential oils, Disp: , Rfl:    promethazine-dextromethorphan (PROMETHAZINE-DM) 6.25-15 MG/5ML syrup, Take 5 mLs by mouth 4 (four) times daily as needed for cough., Disp: 118 mL, Rfl: 0   QUEtiapine (SEROQUEL XR) 300 MG 24 hr tablet, Take 300 mg by mouth daily., Disp: , Rfl:    QUEtiapine (SEROQUEL) 100 MG tablet, Take 100 mg by mouth at bedtime., Disp: , Rfl:    SUMAtriptan (IMITREX) 100 MG tablet, Take 100 mg by mouth every 2 (two) hours as needed for migraine or headache. May repeat in 2 hours if headache persists or recurs., Disp: , Rfl:    zonisamide (ZONEGRAN) 100 MG capsule, Take 600 mg by mouth at bedtime. , Disp: , Rfl:    Allergies  Allergen Reactions   Ultram [Tramadol Hcl] Hives and Nausea And Vomiting   Ivp Dye [Iodinated Diagnostic Agents] Itching and Nausea And Vomiting   Percocet [Oxycodone-Acetaminophen] Hives and Itching   Pyridium Plus [Phenazopyridine-Butabarb-Hyosc]     Rash    Zomig [Zolmitriptan] Other (See Comments)    Chest pain, palpitations, shortness of breath, bright red rash across cxhest, tightness of chest   Codeine Itching, Nausea And Vomiting and Rash   Ditropan [Oxybutynin Chloride] Rash   Hydrocodone Itching, Nausea And Vomiting and Rash  Past Medical History:  Diagnosis Date   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  Social History   Tobacco Use   Smoking status: Never   Smokeless tobacco: Never  Substance Use Topics   Alcohol use: No   Drug use: No    ROS   Objective:   Vitals: BP 100/67 (BP Location: Left Arm) Comment (BP Location): large cuff  Pulse 66   Temp 98.1 F (36.7 C) (Oral)   Resp 20   LMP 05/19/2021   SpO2 99%    Physical Exam Constitutional:      General: She is not in acute distress.    Appearance: Normal appearance. She is well-developed. She is not ill-appearing, toxic-appearing or diaphoretic.  HENT:     Head: Normocephalic and atraumatic.     Right Ear: Tympanic membrane and ear canal normal. No drainage or tenderness. No middle ear effusion. Tympanic membrane is not erythematous.     Left Ear: Tympanic membrane and ear canal normal. No drainage or tenderness.  No middle ear effusion. Tympanic membrane is not erythematous.     Nose: Nose normal. No congestion or rhinorrhea.     Mouth/Throat:     Mouth: Mucous membranes are moist. No oral lesions.     Pharynx: Oropharynx is clear. No pharyngeal swelling, oropharyngeal exudate, posterior oropharyngeal erythema or uvula swelling.     Tonsils: No tonsillar exudate or tonsillar abscesses.  Eyes:     Extraocular Movements: Extraocular movements intact.     Right eye: Normal extraocular motion.     Left eye: Normal extraocular motion.     Conjunctiva/sclera: Conjunctivae normal.     Pupils: Pupils are equal, round, and reactive to light.  Cardiovascular:     Rate and Rhythm: Normal rate and regular rhythm.     Pulses: Normal pulses.     Heart sounds: Normal heart sounds. No murmur heard.   No friction rub. No gallop.  Pulmonary:     Effort: Pulmonary effort is normal. No respiratory distress.     Breath sounds: Normal breath sounds. No stridor. No wheezing, rhonchi or rales.  Musculoskeletal:     Cervical back: Normal range of motion and neck supple.  Lymphadenopathy:     Cervical: No cervical adenopathy.  Skin:    General: Skin is warm and dry.     Findings: No rash.  Neurological:     General: No focal deficit present.     Mental Status: She is alert and oriented to person, place, and time.  Psychiatric:        Mood and Affect: Mood normal.        Behavior: Behavior normal.        Thought Content: Thought content normal.    Results for orders placed or performed during the hospital encounter of 06/13/21 (from the past 24 hour(s))  POC Influenza A & B Ag (Urgent Care)     Status: None   Collection Time: 06/13/21  9:30 AM  Result Value Ref Range   INFLUENZA A ANTIGEN, POC NEGATIVE NEGATIVE   INFLUENZA B ANTIGEN, POC NEGATIVE NEGATIVE    Assessment and Plan :   PDMP not  reviewed this encounter.  1. Viral URI with cough   2. Nasal congestion   3. History of cerebral aneurysm    Point-of-care influenza test negative.  Suspect viral URI, viral syndrome; physical exam findings reassuring and vital signs stable for discharge. Advised supportive care, offered symptomatic relief. Deferred imaging given clear cardiopulmonary exam, hemodynamically stable vital signs.  Counseled patient on potential for adverse effects with medications prescribed/recommended today, ER and return-to-clinic precautions discussed, patient verbalized understanding.     Jaynee Eagles, PA-C 06/13/21 1025

## 2021-06-20 ENCOUNTER — Ambulatory Visit (INDEPENDENT_AMBULATORY_CARE_PROVIDER_SITE_OTHER): Payer: BC Managed Care – PPO | Admitting: Family Medicine

## 2021-06-20 ENCOUNTER — Encounter: Payer: Self-pay | Admitting: Family Medicine

## 2021-06-20 ENCOUNTER — Other Ambulatory Visit: Payer: Self-pay

## 2021-06-20 DIAGNOSIS — F5081 Binge eating disorder: Secondary | ICD-10-CM | POA: Diagnosis not present

## 2021-06-20 NOTE — Patient Instructions (Addendum)
Obstacles to getting more vegetables:  - Taste preference (bored if repeated too often)    - Likes a limited variety of vegetables - Most vegetables require preparation Ways to overcome those obstacles:  - Consider how you can get nutritious meals without too much work/time invested.  Avoid all-or-none thinking, i.e., on "pizza night," get take-out pizza, and pair it with some kind of vegetable and/or fruit.   - Fresh fruits/veg's that last well: Carrots, cabbage, oranges, apples.  (Also keep frozen veg's on hand.)  - Expand taste preferences:      Make three lists of vegetables:     1. Those you like and eat now    2. Vegetables you won't even consider    3. Vegetables you might consider trying if they are prepared a certain way Continue to eat vegetables you currently eat, but from list #3, choose a vegetable to try at least 3 times a week.  Use small amounts of this vegetable, cut small, combined with foods or seasonings you like.    Goal:  Eat breakfast, lunch, & dinner, conforming to recommended criteria (breakfast to include protein & carb & >300 kcal; lunch and dinner to include veg's & protein).  Protein alternatives to meat and fish: nuts&seeds or nut butter (be cautious with portion size) yogurt, cheese or string cheese, protein shake or bar.  Post this list of protein foods on the inside of a kitchen cupboard.   TRACKING:    Record B, L, and D for each meal that meets goal.                          Write down weekly totals/averages [# of meals/7 days] at the right of each week.    From 8/15 appt:  Ways you can help prevent out-of-control eating:  - Continue to eat breakfast daily.   - Plan and pack lunch for each work day.   - Plan and pack an afternoon snack for work each day.   - Make a list of snack options, e.g., any fresh fruit, string cheese, yogurt, nuts, crackers w/ cheese or PB, bag of Skinny Pop popcorn, protein bar.    Follow-up appt in the office on Monday, 11/28 at  4 PM.

## 2021-06-20 NOTE — Progress Notes (Signed)
Telehealth Medical Nutrition Therapy PCP Milagros Evener, [place]  Therapist Lilyan Punt  Patient has completed 2nd dose of COVID-19 vaccine. Appt start time: 1600 end time: 1700 (1 hour)  Relevant history/background: Katherine [name] was referred by therapist Lilyan Punt for MNT related to weight management and disordered eating.  She was seen for MNT in 2017 and 2018.  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  She grew up with a highly critical mother, who secured weight loss pills for Katherine [name] her senior yr in high school.  Diet history includes fluctuation between severe restriction and binge eating.  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    Assessment:  Katherine [name] has not been seen for nutrition since August b/c of schedule conflicts and illness.  She was sick with a URI again last week.  She is mostly recovered now,but has not gotten back into previous routines for eating or exercise.  She has usually done ok in planning foods except for including vegetables.   Usual eating pattern: 3 meals and 1-2 snacks per day.   Loss of control eating: Denies, although sometimes feels guilty when eating sweets, which she has been having at least daily.   Recent physical activity: Has not been able to walk recently; cannot make herself get up early enough to walk before work (already gets up at 5:30 AM M-F), and after work is too hard to squeeze in homework help for kids and dinner prep.   24-hr recall:  (Up at 5:30 AM) B (6:15 AM)-  Water B (7 AM)-  1 c coffee, 2 tbsp flvrd creamer, 1/2 tsp sugar, 6 oz Orgain protein drink, 5 oz fat-free, low-sugar yogurt Snk ( AM)-  --- L (11:10 PM)-  1 slc meatloaf, 1/2 c mashed potatoes, 1+ c green beans, vitamin water Snk  (4 PM)-  1 oz Skinny Pop popcorn (80 kcal), water D (6 PM)-  4-6 oz chx, 1 tbsp mac&chs, 2 tbsp mashed potatoes, 5 oz fat-free, low-sugar yogurt, 8 oz Sprite Snk (7:30)-  1 cupcake Typical day? Yes.    Nutritional Diagnosis: Continued progress noted on NB-1.5 Disordered eating pattern as related to binge eating as evidenced by no eating episodes with sense of loss of control.    Intervention: Completed diet and exercise history, confirmed behavioral goals, and discussed obstacles to getting more vegetables.  Monitoring/Evaluation:  Dietary intake, exercise, and body weight in 4 weeks.

## 2021-07-22 ENCOUNTER — Ambulatory Visit: Payer: BC Managed Care – PPO | Admitting: Family Medicine

## 2021-11-02 ENCOUNTER — Emergency Department (HOSPITAL_COMMUNITY): Payer: BC Managed Care – PPO

## 2021-11-02 ENCOUNTER — Other Ambulatory Visit: Payer: Self-pay

## 2021-11-02 ENCOUNTER — Emergency Department (HOSPITAL_COMMUNITY)
Admission: EM | Admit: 2021-11-02 | Discharge: 2021-11-02 | Disposition: A | Discharge status: Home / Self Care | Payer: BC Managed Care – PPO | Attending: Emergency Medicine | Admitting: Emergency Medicine

## 2021-11-02 ENCOUNTER — Encounter (HOSPITAL_COMMUNITY): Payer: Self-pay | Admitting: Emergency Medicine

## 2021-11-02 DIAGNOSIS — W1812XA Fall from or off toilet with subsequent striking against object, initial encounter: Secondary | ICD-10-CM | POA: Insufficient documentation

## 2021-11-02 DIAGNOSIS — R55 Syncope and collapse: Secondary | ICD-10-CM

## 2021-11-02 DIAGNOSIS — Z7982 Long term (current) use of aspirin: Secondary | ICD-10-CM | POA: Insufficient documentation

## 2021-11-02 DIAGNOSIS — S0003XA Contusion of scalp, initial encounter: Secondary | ICD-10-CM | POA: Insufficient documentation

## 2021-11-02 DIAGNOSIS — S0990XA Unspecified injury of head, initial encounter: Secondary | ICD-10-CM

## 2021-11-02 DIAGNOSIS — Y92002 Bathroom of unspecified non-institutional (private) residence single-family (private) house as the place of occurrence of the external cause: Secondary | ICD-10-CM | POA: Insufficient documentation

## 2021-11-02 DIAGNOSIS — D649 Anemia, unspecified: Secondary | ICD-10-CM | POA: Insufficient documentation

## 2021-11-02 LAB — URINALYSIS, ROUTINE W REFLEX MICROSCOPIC
Bilirubin Urine: NEGATIVE
Glucose, UA: NEGATIVE mg/dL
Ketones, ur: NEGATIVE mg/dL
Leukocytes,Ua: NEGATIVE
Nitrite: NEGATIVE
Protein, ur: NEGATIVE mg/dL
Specific Gravity, Urine: 1.01 (ref 1.005–1.030)
pH: 6 (ref 5.0–8.0)

## 2021-11-02 LAB — CBC
HCT: 37 % (ref 36.0–46.0)
Hemoglobin: 11.8 g/dL — ABNORMAL LOW (ref 12.0–15.0)
MCH: 28.4 pg (ref 26.0–34.0)
MCHC: 31.9 g/dL (ref 30.0–36.0)
MCV: 88.9 fL (ref 80.0–100.0)
Platelets: 254 10*3/uL (ref 150–400)
RBC: 4.16 MIL/uL (ref 3.87–5.11)
RDW: 13 % (ref 11.5–15.5)
WBC: 10.2 10*3/uL (ref 4.0–10.5)
nRBC: 0 % (ref 0.0–0.2)

## 2021-11-02 LAB — BASIC METABOLIC PANEL
Anion gap: 10 (ref 5–15)
BUN: 14 mg/dL (ref 6–20)
CO2: 23 mmol/L (ref 22–32)
Calcium: 9 mg/dL (ref 8.9–10.3)
Chloride: 102 mmol/L (ref 98–111)
Creatinine, Ser: 0.81 mg/dL (ref 0.44–1.00)
GFR, Estimated: >60 mL/min (ref >60)
Glucose, Bld: 119 mg/dL — ABNORMAL HIGH (ref 70–99)
Potassium: 3.7 mmol/L (ref 3.5–5.1)
Sodium: 135 mmol/L (ref 135–145)

## 2021-11-02 LAB — I-STAT BETA HCG BLOOD, ED (MC, WL, AP ONLY): I-stat hCG, quantitative: 6.1 m[IU]/mL — ABNORMAL HIGH (ref <5)

## 2021-11-02 MED ORDER — SODIUM CHLORIDE 0.9 % IV BOLUS
1000.0000 mL | Freq: Once | INTRAVENOUS | Status: AC
  Administered 2021-11-02: 1000 mL via INTRAVENOUS

## 2021-11-02 MED ORDER — FENTANYL CITRATE PF 50 MCG/ML IJ SOSY
50.0000 ug | PREFILLED_SYRINGE | Freq: Once | INTRAMUSCULAR | Status: AC
  Administered 2021-11-02: 50 ug via INTRAVENOUS
  Filled 2021-11-02: qty 1

## 2021-11-02 MED ORDER — KETOROLAC TROMETHAMINE 15 MG/ML IJ SOLN
15.0000 mg | Freq: Once | INTRAMUSCULAR | Status: AC
Start: 2021-11-02 — End: 2021-11-02
  Administered 2021-11-02: 15 mg via INTRAVENOUS
  Filled 2021-11-02: qty 1

## 2021-11-02 NOTE — Discharge Instructions (Signed)
Please read and follow all provided instructions. ? ?Your diagnoses today include:  ?1. Syncope, unspecified syncope type   ?2. Minor head injury, initial encounter   ? ? ?Tests performed today include: ?CT scan of your head that did not show any serious injury or other problems ?Complete blood cell count ?Basic metabolic panel ?Urinalysis (urine test) ?Pregnancy test (urine or blood, in women only) ?EKG: no abnormal heart rhythms or other problems ?Vital signs. See below for your results today.  ? ?Medications prescribed:  ?None ? ?Take any prescribed medications only as directed. ? ?Home care instructions:  ?Follow any educational materials contained in this packet. ? ?BE VERY CAREFUL not to take multiple medicines containing Tylenol (also called acetaminophen). Doing so can lead to an overdose which can damage your liver and cause liver failure and possibly death.  ? ?Follow-up instructions: ?Please follow-up with your primary care provider in the next 3 days for further evaluation of your symptoms.  ? ?Return instructions:  ?SEEK IMMEDIATE MEDICAL ATTENTION IF: ?There is confusion or drowsiness (although children frequently become drowsy after injury).  ?You cannot awaken the injured person.  ?You have more than one episode of vomiting.  ?You notice dizziness or unsteadiness which is getting worse, or inability to walk.  ?You have convulsions or unconsciousness.  ?You experience severe, persistent headaches not relieved by Tylenol. ?You cannot use arms or legs normally.  ?There are changes in pupil sizes. (This is the black center in the colored part of the eye)  ?There is clear or bloody discharge from the nose or ears.  ?You have change in speech, vision, swallowing, or understanding.  ?Localized weakness, numbness, tingling, or change in bowel or bladder control. ?You have any other emergent concerns. ? ?Additional Information: ?You have had a head injury which does not appear to require admission at this  time. ? ?Your vital signs today were: ?BP 105/61   Pulse 79   Temp 98 ?F (36.7 ?C) (Oral)   Resp 10   Ht '5\' 3"'$  (1.6 m)   Wt 102.1 kg   SpO2 97%   BMI 39.86 kg/m?  ?If your blood pressure (BP) was elevated above 135/85 this visit, please have this repeated by your doctor within one month. ?-------------- ? ?

## 2021-11-02 NOTE — ED Triage Notes (Signed)
BIB [facility] from home. Woke up from bed around 2245-->got hot-->woke up on floor. Hit front right head on bathroom scale--> contusion, no hemorrhage, no thinners. No neck pain. Remains dizzy and nauseated. ? ?EMS gave 4 zofran ?IV access ?90/60 initial-->120/58 ED arrival ?CBG 113 ?80HR ?98% RA ?

## 2021-11-02 NOTE — ED Provider Notes (Signed)
[facility] EMERGENCY DEPARTMENT Provider Note   CSN: [id number] Arrival date & time: 11/02/21  0011     History  Chief Complaint  Patient presents with   Loss of Consciousness    Katherine [name] is a 46 y.o. female.  Patient with history of migraine headaches, ICA ophthalmic branch aneurysm, carotid stents --presents to the emergency department today for evaluation of syncope.  Patient states that she went to bed at around 930.  She woke at about 1045 with the urge to urinate.  While on the toilet she began to feel very hot.  She then woke up on the ground.  She apparently hit her right parietal scalp on the ground.  She was out for an unknown period of time.  She was able to crawl and call for help.  She denies neck pain, weakness in the arms or legs, vision change.  She has a right-sided headache.  Zofran given by EMS.  She denies preceding nausea, vomiting, or diarrhea.  She is eating and drinking well.  Per EMS report, BP initially 90/60.  She took an injection of a migraine medication today, otherwise normal state of health.  Denies chest pain, shortness of breath, leg swelling.  No history of blood clot or arrhythmia.      Home Medications Prior to Admission medications   Medication Sig Start Date End Date Taking? Authorizing Provider  amoxicillin-clavulanate (AUGMENTIN) 875-125 MG tablet Take 1 tablet by mouth every 12 (twelve) hours. Patient not taking: Reported on 06/13/2021 08/16/20   Hazel Sams, PA-C  ASPIRIN 81 PO Take by mouth. Patient not taking: Reported on 06/13/2021    [provider]  beclomethasone (QVAR) 40 MCG/ACT inhaler 1 puff    [provider]  diazepam (VALIUM) 2 MG tablet Take one tablet one hour prior to MRI 03/14/2020. Take one additional tablet thirty minutes later if desired effects not achieved. Do not drive self while taking this medication. 03/14/20   Louk, Bea Graff, PA-C  Elagolix Sodium (ORILISSA) 150 MG  TABS [place]issa 150 mg tablet  TAKE 1 TABLET BY MOUTH EVERY DAY    [provider]  Elagolix Sodium 150 MG TABS 1 tablet Patient not taking: Reported on 06/13/2021    [provider]  eletriptan (RELPAX) 40 MG tablet Take 40 mg by mouth as needed for migraine or headache. May repeat in 2 hours if headache persists or recurs.    [provider]  Fexofenadine HCl (ALLEGRA PO) Take by mouth. Patient not taking: Reported on 06/13/2021    [provider]  ipratropium (ATROVENT) 0.03 % nasal spray Place 2 sprays into both nostrils 2 (two) times daily. 06/13/21   Jaynee Eagles, PA-C  lamoTRIgine (LAMICTAL) 150 MG tablet Take 150 mg by mouth 2 (two) times daily.    [provider]  lamoTRIgine (LAMICTAL) 150 MG tablet Take by mouth.    [provider]  leuprolide (LUPRON) 11.25 MG injection Inject 11.25 mg into the muscle every 3 (three) months. Patient not taking: Reported on 06/13/2021    [provider]  lurasidone (LATUDA) 40 MG TABS tablet Latuda 40 mg tablet    [provider]  Melatonin 3 MG TABS Take 3 mg by mouth at bedtime.     [provider]  omeprazole (PRILOSEC) 20 MG capsule Take 20 mg by mouth at bedtime.    [provider]  ondansetron (ZOFRAN) 4 MG tablet Take 4 mg by mouth every 6 (six)  hours as needed for nausea or vomiting.    [provider]  OVER THE COUNTER MEDICATION Apply 1 application topically 4 (four) times a week. Essential oils    [provider]  promethazine-dextromethorphan (PROMETHAZINE-DM) 6.25-15 MG/5ML syrup Take 5 mLs by mouth 4 (four) times daily as needed for cough. Patient not taking: Reported on 06/13/2021 08/16/20   Hazel Sams, PA-C  propranolol (INDERAL) 20 MG tablet TAKE 3-4 TABS NIGHTLY AT BEDTIME AS TOLERATED. 03/13/21   [provider]  QUEtiapine (SEROQUEL XR) 300 MG 24 hr tablet Take 300 mg by mouth daily.    [provider]   QUEtiapine (SEROQUEL) 100 MG tablet Take 100 mg by mouth at bedtime.    [provider]  SUMAtriptan (IMITREX) 100 MG tablet Take 100 mg by mouth every 2 (two) hours as needed for migraine or headache. May repeat in 2 hours if headache persists or recurs.    [provider]  zonisamide (ZONEGRAN) 100 MG capsule Take 600 mg by mouth at bedtime.  Patient not taking: Reported on 06/13/2021    [provider]      Allergies    Ultram [tramadol hcl], Ivp dye [iodinated contrast media], Percocet [oxycodone-acetaminophen], Pyridium plus [phenazopyridine-butabarb-hyosc], Zomig [zolmitriptan], Codeine, Ditropan [oxybutynin chloride], and Hydrocodone    Review of Systems   Review of Systems  Physical Exam Updated Vital Signs BP 107/65 (BP Location: Right Arm)    Pulse 76    Temp 98 F (36.7 C) (Oral)    Resp 18    Ht '5\' 3"'$  (1.6 m)    Wt 102.1 kg    SpO2 99%    BMI 39.86 kg/m  Physical Exam Vitals and nursing note reviewed.  Constitutional:      Appearance: She is well-developed.  HENT:     Head: Normocephalic. No raccoon eyes or Battle's sign.     Comments: Right parietal scalp contusion    Right Ear: Tympanic membrane, ear canal and external ear normal. No hemotympanum.     Left Ear: Tympanic membrane, ear canal and external ear normal. No hemotympanum.     Nose: Nose normal.     Mouth/Throat:     Mouth: Mucous membranes are moist.     Pharynx: Uvula midline.  Eyes:     General: Lids are normal.     Extraocular Movements:     Right eye: No nystagmus.     Left eye: No nystagmus.     Conjunctiva/sclera: Conjunctivae normal.     Pupils: Pupils are equal, round, and reactive to light.     Comments: No visible hyphema noted  Cardiovascular:     Rate and Rhythm: Normal rate and regular rhythm.  Pulmonary:     Effort: Pulmonary effort is normal.     Breath sounds: Normal breath sounds.  Abdominal:     Palpations: Abdomen is soft.     Tenderness: There is no  abdominal tenderness.  Musculoskeletal:     Cervical back: Normal range of motion and neck supple. No tenderness or bony tenderness.     Thoracic back: No tenderness or bony tenderness.     Lumbar back: No tenderness or bony tenderness.     Comments: No cervical spine tenderness  Skin:    General: Skin is warm and dry.  Neurological:     Mental Status: She is alert and oriented to person, place, and time.     GCS: GCS eye subscore is 4. GCS verbal subscore is 5.  GCS motor subscore is 6.     Cranial Nerves: No cranial nerve deficit.     Sensory: No sensory deficit.     Coordination: Coordination normal.    ED Results / Procedures / Treatments   Labs (all labs ordered are listed, but only abnormal results are displayed) Labs Reviewed  BASIC METABOLIC PANEL - Abnormal; Notable for the following components:      Result Value   Glucose, Bld 119 (*)    All other components within normal limits  CBC - Abnormal; Notable for the following components:   Hemoglobin 11.8 (*)    All other components within normal limits  URINALYSIS, ROUTINE W REFLEX MICROSCOPIC - Abnormal; Notable for the following components:   Hgb urine dipstick LARGE (*)    Bacteria, UA RARE (*)    All other components within normal limits  I-STAT BETA HCG BLOOD, ED ([facility], [facility], [facility] ONLY) - Abnormal; Notable for the following components:   I-stat hCG, quantitative 6.1 (*)    All other components within normal limits  CBG MONITORING, ED    EKG EKG Interpretation  Date/Time:  Saturday November 02 2021 00:20:08 EST Ventricular Rate:  80 PR Interval:  180 QRS Duration: 89 QT Interval:  418 QTC Calculation: 483 R Axis:   44 Text Interpretation: Sinus rhythm No significant change was found Confirmed by Gerlene Fee [id number]) on 11/02/2021 1:06:33 AM  Radiology CT HEAD WO CONTRAST (5MM)  Result Date: 11/02/2021 CLINICAL DATA:  Head trauma, moderately severe. Syncope. Right parietal contusion from a fall. History of  intracranial aneurysm. EXAM: CT HEAD WITHOUT CONTRAST TECHNIQUE: Contiguous axial images were obtained from the base of the skull through the vertex without intravenous contrast. RADIATION DOSE REDUCTION: This exam was performed according to the departmental dose-optimization program which includes automated exposure control, adjustment of the mA and/or kV according to patient size and/or use of iterative reconstruction technique. COMPARISON:  CT head 07/07/2016.  MRI brain 03/14/2020 FINDINGS: Brain: No evidence of acute infarction, hemorrhage, hydrocephalus, extra-axial collection or mass lesion/mass effect. Vascular: Vascular coils in the suprasellar region bilaterally. Skull: Calvarium appears intact. Sinuses/Orbits: Paranasal sinuses and mastoid air cells are clear. Other: None. IMPRESSION: No acute intracranial abnormalities. Electronically Signed   By: Lucienne Capers M.D.   On: 11/02/2021 01:58    Procedures Procedures    Medications Ordered in ED Medications  sodium chloride 0.9 % bolus 1,000 mL (0 mLs Intravenous Stopped 11/02/21 0358)  ketorolac (TORADOL) 15 MG/ML injection 15 mg (15 mg Intravenous Given 11/02/21 0215)  fentaNYL (SUBLIMAZE) injection 50 mcg (50 mcg Intravenous Given 11/02/21 0215)    ED Course/ Medical Decision Making/ A&P    Patient seen and examined. History obtained directly from patient and from EMS report.    Labs/EKG: Ordered CBC, BMP, pregnancy, EKG.  Imaging: Ordered CT head.  Medications/Fluids: Ordered: IV fluid bolus.   Most recent vital signs reviewed and are as follows: BP 107/65 (BP Location: Right Arm)    Pulse 76    Temp 98 F (36.7 C) (Oral)    Resp 18    Ht '5\' 3"'$  (1.6 m)    Wt 102.1 kg    SpO2 99%    BMI 39.86 kg/m   Initial impression: Syncope, head injury  4:26 AM Reassessment performed. Patient appears stable.  States that headache is improved with treatment.  She has been up to the bathroom twice without any issues.  Labs personally  reviewed and interpreted including: UA with some blood, no  other signs of infection; beta-hCG 6.1 likely false positive; CBC with minimal normocytic anemia 11.8 hemoglobin; BMP normal.  EKG, agree normal.  Imaging personally visualized and interpreted including: CT head, agree no obvious bleeding  Orthostatic VS for the past 24 hrs:  BP- Lying Pulse- Lying BP- Sitting Pulse- Sitting BP- Standing at 0 minutes Pulse- Standing at 0 minutes  11/02/21 0110 103/59 86 109/72 94 -- --  11/02/21 0038 108/69 80 115/80 84 123/81 83  \ Reviewed pertinent lab work and imaging with patient at bedside. Questions answered.   Most current vital signs reviewed and are as follows: BP 108/63 (BP Location: Right Arm)    Pulse 73    Temp 98 F (36.7 C) (Oral)    Resp 12    Ht '5\' 3"'$  (1.6 m)    Wt 102.1 kg    SpO2 100%    BMI 39.86 kg/m   Plan: Complete fluids, discharge home if still doing okay.  Follow-up: Encouraged PCP follow-up in the next 3 days for recheck of symptoms.  Return instructions: Return to the emergency department with additional episodes of syncope, development of chest pain or shortness of breath                           Medical Decision Making Amount and/or Complexity of Data Reviewed Labs: ordered. Radiology: ordered.  Risk Prescription drug management.   Patient presents today with episode of syncope with prodrome.  Otherwise in normal state of health prior.  She hit her head when she fell.  She has a history of aneurysm and stenting.  Head CT without signs of bleeding today.  No thunderclap headache at onset.  She had a headache after she awoke.  Blood pressure was initially low with EMS, not orthostatic here.  She was treated with IV fluids.  Labs are reassuring without significant anemia.  EKG was normal without arrhythmia, signs of prolonged QT, Brugada syndrome, WPW, heart block.  She has recovered well.  Considered PE but no risk factors and PERC negative.  No abdominal pain  to suggest intra-abdominal etiology such as gallbladder disease, kidney stone, ovarian torsion.  Unclear cause of syncope but overall low risk.  Do not think that she requires angiography today.  Encourage PCP follow-up.  She seems reliable to return if symptoms worsen.  The patient's vital signs, pertinent lab work and imaging were reviewed and interpreted as discussed in the ED course. Hospitalization was considered for further testing, treatments, or serial exams/observation. However as patient is well-appearing, has a stable exam, and reassuring studies today, I do not feel that they warrant admission at this time. This plan was discussed with the patient who verbalizes agreement and comfort with this plan and seems reliable and able to return to the Emergency Department with worsening or changing symptoms.          Final Clinical Impression(s) / ED Diagnoses Final diagnoses:  Syncope, unspecified syncope type  Minor head injury, initial encounter    Rx / DC Orders ED Discharge Orders     None         Carlisle Cater, PA-C 11/02/21 0430    Maudie Flakes, MD 11/02/21 [phone]

## 2022-12-17 ENCOUNTER — Emergency Department (HOSPITAL_BASED_OUTPATIENT_CLINIC_OR_DEPARTMENT_OTHER): Payer: BC Managed Care – PPO

## 2022-12-17 ENCOUNTER — Other Ambulatory Visit: Payer: Self-pay

## 2022-12-17 ENCOUNTER — Encounter (HOSPITAL_BASED_OUTPATIENT_CLINIC_OR_DEPARTMENT_OTHER): Payer: Self-pay | Admitting: Emergency Medicine

## 2022-12-17 ENCOUNTER — Emergency Department (HOSPITAL_BASED_OUTPATIENT_CLINIC_OR_DEPARTMENT_OTHER)
Admission: EM | Admit: 2022-12-17 | Discharge: 2022-12-17 | Disposition: A | Discharge status: Home / Self Care | Payer: BC Managed Care – PPO | Attending: Emergency Medicine | Admitting: Emergency Medicine

## 2022-12-17 DIAGNOSIS — R29818 Other symptoms and signs involving the nervous system: Secondary | ICD-10-CM | POA: Diagnosis not present

## 2022-12-17 DIAGNOSIS — Z7982 Long term (current) use of aspirin: Secondary | ICD-10-CM | POA: Diagnosis not present

## 2022-12-17 DIAGNOSIS — R55 Syncope and collapse: Secondary | ICD-10-CM | POA: Insufficient documentation

## 2022-12-17 DIAGNOSIS — R519 Headache, unspecified: Secondary | ICD-10-CM | POA: Diagnosis not present

## 2022-12-17 DIAGNOSIS — N3000 Acute cystitis without hematuria: Secondary | ICD-10-CM | POA: Insufficient documentation

## 2022-12-17 LAB — RAPID URINE DRUG SCREEN, HOSP PERFORMED
Amphetamines: NOT DETECTED
Barbiturates: NOT DETECTED
Benzodiazepines: NOT DETECTED
Cocaine: NOT DETECTED
Opiates: NOT DETECTED
Tetrahydrocannabinol: NOT DETECTED

## 2022-12-17 LAB — BASIC METABOLIC PANEL
Anion gap: 7 (ref 5–15)
BUN: 14 mg/dL (ref 6–20)
CO2: 25 mmol/L (ref 22–32)
Calcium: 8.8 mg/dL — ABNORMAL LOW (ref 8.9–10.3)
Chloride: 104 mmol/L (ref 98–111)
Creatinine, Ser: 0.74 mg/dL (ref 0.44–1.00)
GFR, Estimated: >60 mL/min (ref >60)
Glucose, Bld: 111 mg/dL — ABNORMAL HIGH (ref 70–99)
Potassium: 3.7 mmol/L (ref 3.5–5.1)
Sodium: 136 mmol/L (ref 135–145)

## 2022-12-17 LAB — URINALYSIS, MICROSCOPIC (REFLEX): WBC, UA: NONE SEEN WBC/hpf (ref 0–5)

## 2022-12-17 LAB — CBC
HCT: 37.4 % (ref 36.0–46.0)
Hemoglobin: 12.4 g/dL (ref 12.0–15.0)
MCH: 29 pg (ref 26.0–34.0)
MCHC: 33.2 g/dL (ref 30.0–36.0)
MCV: 87.4 fL (ref 80.0–100.0)
Platelets: 282 10*3/uL (ref 150–400)
RBC: 4.28 MIL/uL (ref 3.87–5.11)
RDW: 13.2 % (ref 11.5–15.5)
WBC: 8.1 10*3/uL (ref 4.0–10.5)
nRBC: 0 % (ref 0.0–0.2)

## 2022-12-17 LAB — PREGNANCY, URINE: Preg Test, Ur: NEGATIVE

## 2022-12-17 LAB — URINALYSIS, ROUTINE W REFLEX MICROSCOPIC
Bilirubin Urine: NEGATIVE
Glucose, UA: 100 mg/dL — AB
Ketones, ur: NEGATIVE mg/dL
Leukocytes,Ua: NEGATIVE
Nitrite: POSITIVE — AB
Protein, ur: NEGATIVE mg/dL
Specific Gravity, Urine: 1.015 (ref 1.005–1.030)
pH: 8.5 — ABNORMAL HIGH (ref 5.0–8.0)

## 2022-12-17 LAB — PROTIME-INR
INR: 1.1 (ref 0.8–1.2)
Prothrombin Time: 13.6 seconds (ref 11.4–15.2)

## 2022-12-17 LAB — APTT: aPTT: 31 seconds (ref 24–36)

## 2022-12-17 LAB — CBG MONITORING, ED: Glucose-Capillary: 113 mg/dL — ABNORMAL HIGH (ref 70–99)

## 2022-12-17 LAB — ETHANOL: Alcohol, Ethyl (B): <10 mg/dL (ref <10)

## 2022-12-17 MED ORDER — CEPHALEXIN 500 MG PO CAPS: 500.0000 mg | ORAL_CAPSULE | Freq: Two times a day (BID) | ORAL | 0 refills | Status: AC

## 2022-12-17 MED ORDER — DIPHENHYDRAMINE HCL 50 MG/ML IJ SOLN
50.0000 mg | Freq: Once | INTRAMUSCULAR | Status: AC
  Administered 2022-12-17: 50 mg via INTRAVENOUS
  Filled 2022-12-17: qty 1

## 2022-12-17 MED ORDER — PROCHLORPERAZINE EDISYLATE 10 MG/2ML IJ SOLN
10.0000 mg | Freq: Once | INTRAMUSCULAR | Status: AC
  Administered 2022-12-17: 10 mg via INTRAVENOUS
  Filled 2022-12-17: qty 2

## 2022-12-17 MED ORDER — SODIUM CHLORIDE 0.9 % IV BOLUS
1000.0000 mL | Freq: Once | INTRAVENOUS | Status: AC
  Administered 2022-12-17: 1000 mL via INTRAVENOUS

## 2022-12-17 NOTE — Discharge Instructions (Signed)
Your history, exam, workup today are consistent with recurrent complex migraine leading to the syncopal episode, headache, and a transient neurologic symptoms.  The CT of the head was reassuring and we offered ED to ED transfer for MRI and MRA as recommended by neurology however given near complete resolution of symptoms and feeling better, he would rather go home and follow-up with your outpatient teams which is reasonable.  Please take the antibiotics for the UTI we found given your urinary symptoms.  Please rest and stay hydrated.  Please follow-up with your neurology and primary doctors.  If any symptoms change or worsen acutely, please return to the nearest emergency department.

## 2022-12-17 NOTE — ED Triage Notes (Signed)
Pt had LOC at work today after feeling lightheaded; she woke up on the floor of a bathroom; recent episode where she thought she fell asleep at the wheel; appt w/ neuro next Monday; c/o increased fatigue

## 2022-12-17 NOTE — ED Notes (Signed)
Pt states she does feel better after meds given.  Rates her HA 4/10

## 2022-12-17 NOTE — ED Notes (Signed)
Pt was able to ambulate to bathroom under own power.

## 2022-12-17 NOTE — Progress Notes (Signed)
Neurology telephone note  This is a 47 yo woman with hx intractable migraine with episodic complex migraines, L ICA ophthalmic segment aneurysm previously followed by Dr. Corliss Skains last imaged in 2021, who presents for headache. She has hx syncope and had 1-2 events of this within the past week. She has hx complex migraines and has a persistent headache today for which she sought care in the ED at [facility]. Dr. Rush Landmark contacted me to let me know that she had some unilateral numbness and facial droop on exam. Patient states she has had this with complex migraines in the past. She has a contrast allergy and is unable to get CTA.   Recommendations: - Noncon head CT, page neuro if abnormal - Transfer patient ED to [facility] for MRA head and MRI brain wwo to assess stability of aneurysm and rule out alternative etiology for headache - If imaging is unremarkable but patient reports symptoms concerning for [facility] such as worst headache of her life, 10/10 severity, maximal at onset, thunderclap in character then would recommend LP r/o SAH if headache duration >6 hours - Please page me with any questions prior to 8pm. After 8pm please call the [facility] neurohospitalist on call in amion.  Dr. Rush Landmark and I discussed the utility of calling a stroke code for the facial droop and sensory deficit but I did not feel this was warranted because 1) the symptoms are typical of her complicated migraines 2) symptoms too mild to warrant treatment with TNK, and 3) this would delay the care that is actually warranted (see above recommendations).  Bing Neighbors, MD [facility] [phone]  If 7pm- 7am, please page neurology on call as listed in AMION.

## 2022-12-17 NOTE — ED Notes (Signed)
Pt urine is orange because she took AZO
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
## 2023-07-21 ENCOUNTER — Other Ambulatory Visit: Payer: Self-pay | Admitting: Gynecology

## 2023-07-21 DIAGNOSIS — Z1231 Encounter for screening mammogram for malignant neoplasm of breast: Secondary | ICD-10-CM

## 2023-07-30 ENCOUNTER — Other Ambulatory Visit: Payer: Self-pay | Admitting: Gynecology

## 2023-07-30 DIAGNOSIS — R928 Other abnormal and inconclusive findings on diagnostic imaging of breast: Secondary | ICD-10-CM

## 2023-08-04 ENCOUNTER — Ambulatory Visit
Admission: RE | Admit: 2023-08-04 | Discharge: 2023-08-04 | Disposition: A | Discharge status: Home / Self Care | Payer: BC Managed Care – PPO | Source: Ambulatory Visit | Attending: Gynecology | Admitting: Gynecology

## 2023-08-04 ENCOUNTER — Other Ambulatory Visit: Payer: Self-pay | Admitting: Gynecology

## 2023-08-04 DIAGNOSIS — R928 Other abnormal and inconclusive findings on diagnostic imaging of breast: Secondary | ICD-10-CM

## 2023-08-04 DIAGNOSIS — N6310 Unspecified lump in the right breast, unspecified quadrant: Secondary | ICD-10-CM

## 2023-08-04 HISTORY — PX: BREAST BIOPSY: SHX20

## 2023-08-05 LAB — SURGICAL PATHOLOGY

## 2023-08-17 ENCOUNTER — Other Ambulatory Visit: Payer: BC Managed Care – PPO

## 2023-12-21 ENCOUNTER — Emergency Department (HOSPITAL_COMMUNITY)

## 2023-12-21 ENCOUNTER — Other Ambulatory Visit: Payer: Self-pay

## 2023-12-21 ENCOUNTER — Encounter (HOSPITAL_COMMUNITY): Payer: Self-pay

## 2023-12-21 ENCOUNTER — Emergency Department (HOSPITAL_COMMUNITY)
Admission: EM | Admit: 2023-12-21 | Discharge: 2023-12-21 | Disposition: A | Discharge status: Home / Self Care | Attending: Emergency Medicine | Admitting: Emergency Medicine

## 2023-12-21 DIAGNOSIS — R519 Headache, unspecified: Secondary | ICD-10-CM | POA: Diagnosis not present

## 2023-12-21 DIAGNOSIS — G43109 Migraine with aura, not intractable, without status migrainosus: Secondary | ICD-10-CM | POA: Diagnosis not present

## 2023-12-21 DIAGNOSIS — H538 Other visual disturbances: Secondary | ICD-10-CM | POA: Diagnosis present

## 2023-12-21 DIAGNOSIS — Z7982 Long term (current) use of aspirin: Secondary | ICD-10-CM | POA: Insufficient documentation

## 2023-12-21 DIAGNOSIS — M542 Cervicalgia: Secondary | ICD-10-CM | POA: Diagnosis not present

## 2023-12-21 DIAGNOSIS — H539 Unspecified visual disturbance: Secondary | ICD-10-CM

## 2023-12-21 LAB — APTT: aPTT: 33 s (ref 24–36)

## 2023-12-21 LAB — DIFFERENTIAL
Abs Immature Granulocytes: 0.03 10*3/uL (ref 0.00–0.07)
Basophils Absolute: 0.1 10*3/uL (ref 0.0–0.1)
Basophils Relative: 1 %
Eosinophils Absolute: 0.1 10*3/uL (ref 0.0–0.5)
Eosinophils Relative: 1 %
Immature Granulocytes: 0 %
Lymphocytes Relative: 24 %
Lymphs Abs: 2 10*3/uL (ref 0.7–4.0)
Monocytes Absolute: 0.4 10*3/uL (ref 0.1–1.0)
Monocytes Relative: 5 %
Neutro Abs: 5.7 10*3/uL (ref 1.7–7.7)
Neutrophils Relative %: 69 %

## 2023-12-21 LAB — ETHANOL: Alcohol, Ethyl (B): <15 mg/dL (ref <15)

## 2023-12-21 LAB — COMPREHENSIVE METABOLIC PANEL WITH GFR
ALT: 19 U/L (ref 0–44)
AST: 23 U/L (ref 15–41)
Albumin: 4.3 g/dL (ref 3.5–5.0)
Alkaline Phosphatase: 70 U/L (ref 38–126)
Anion gap: 11 (ref 5–15)
BUN: 14 mg/dL (ref 6–20)
CO2: 26 mmol/L (ref 22–32)
Calcium: 9.6 mg/dL (ref 8.9–10.3)
Chloride: 102 mmol/L (ref 98–111)
Creatinine, Ser: 1.05 mg/dL — ABNORMAL HIGH (ref 0.44–1.00)
GFR, Estimated: >60 mL/min (ref >60)
Glucose, Bld: 109 mg/dL — ABNORMAL HIGH (ref 70–99)
Potassium: 4.1 mmol/L (ref 3.5–5.1)
Sodium: 139 mmol/L (ref 135–145)
Total Bilirubin: 0.9 mg/dL (ref 0.0–1.2)
Total Protein: 7.6 g/dL (ref 6.5–8.1)

## 2023-12-21 LAB — I-STAT CHEM 8, ED
BUN: 17 mg/dL (ref 6–20)
Calcium, Ion: 1.14 mmol/L — ABNORMAL LOW (ref 1.15–1.40)
Chloride: 103 mmol/L (ref 98–111)
Creatinine, Ser: 1 mg/dL (ref 0.44–1.00)
Glucose, Bld: 106 mg/dL — ABNORMAL HIGH (ref 70–99)
HCT: 40 % (ref 36.0–46.0)
Hemoglobin: 13.6 g/dL (ref 12.0–15.0)
Potassium: 3.9 mmol/L (ref 3.5–5.1)
Sodium: 140 mmol/L (ref 135–145)
TCO2: 26 mmol/L (ref 22–32)

## 2023-12-21 LAB — CBC
HCT: 40.5 % (ref 36.0–46.0)
Hemoglobin: 13 g/dL (ref 12.0–15.0)
MCH: 28.4 pg (ref 26.0–34.0)
MCHC: 32.1 g/dL (ref 30.0–36.0)
MCV: 88.4 fL (ref 80.0–100.0)
Platelets: 326 10*3/uL (ref 150–400)
RBC: 4.58 MIL/uL (ref 3.87–5.11)
RDW: 13.2 % (ref 11.5–15.5)
WBC: 8.2 10*3/uL (ref 4.0–10.5)
nRBC: 0 % (ref 0.0–0.2)

## 2023-12-21 LAB — CBG MONITORING, ED: Glucose-Capillary: 107 mg/dL — ABNORMAL HIGH (ref 70–99)

## 2023-12-21 LAB — RAPID URINE DRUG SCREEN, HOSP PERFORMED
Amphetamines: NOT DETECTED
Barbiturates: NOT DETECTED
Benzodiazepines: NOT DETECTED
Cocaine: NOT DETECTED
Opiates: NOT DETECTED
Tetrahydrocannabinol: NOT DETECTED

## 2023-12-21 LAB — PROTIME-INR
INR: 1.1 (ref 0.8–1.2)
Prothrombin Time: 14.2 s (ref 11.4–15.2)

## 2023-12-21 MED ORDER — LORAZEPAM 2 MG/ML IJ SOLN
2.0000 mg | Freq: Once | INTRAMUSCULAR | Status: AC
  Administered 2023-12-21: 2 mg via INTRAVENOUS
  Filled 2023-12-21: qty 1

## 2023-12-21 MED ORDER — ASPIRIN 81 MG PO TBEC
81.0000 mg | DELAYED_RELEASE_TABLET | Freq: Every day | ORAL | Status: DC
  Administered 2023-12-21: 81 mg via ORAL
  Filled 2023-12-21: qty 1

## 2023-12-21 MED ORDER — LORAZEPAM 2 MG/ML PO CONC: 2.0000 mg | Freq: Once | ORAL | Status: DC

## 2023-12-21 NOTE — Consult Note (Signed)
 NEUROLOGY CONSULT NOTE   Date of service: December 21, 2023 Patient Name: Katherine [name] MRN:  [id number] DOB:  Oct 08, 1975 Chief Complaint: "Visual blurring" Requesting Provider: Albertus Hughs, DO  History of Present Illness  Katherine [name] is a 48 y.o. 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 Code Stroke called in Triage.   LKW: 1230 Modified rankin score: 0-Completely asymptomatic and back to baseline post- stroke IV Thrombolysis:  No: Low suspicion for stroke.  EVT: No: Presentation not consistent with LVO   NIHSS components Score: Comment  1a Level of Conscious 0[x]  1[]  2[]  3[]      1b LOC Questions 0[x]  1[]  2[]       1c LOC Commands 0[x]  1[]  2[]       2 Best Gaze 0[x]  1[]  2[]       3 Visual 0[x]  1[]  2[]  3[]      4 Facial Palsy 0[x]  1[]  2[]  3[]      5a Motor Arm - left 0[x]  1[]  2[]  3[]  4[]  UN[]    5b Motor Arm - Right 0[x]  1[]  2[]  3[]  4[]  UN[]    6a Motor Leg - Left 0[x]  1[]  2[]  3[]  4[]  UN[]    6b Motor Leg - Right 0[x]  1[]  2[]  3[]  4[]  UN[]    7 Limb Ataxia 0[x]  1[]  2[]  UN[]      8 Sensory 0[x]  1[]  2[]  UN[]      9 Best Language 0[x]  1[]  2[]  3[]      10 Dysarthria 0[x]  1[]  2[]  UN[]      11 Extinct. and Inattention 0[x]  1[]  2[]       TOTAL:   0      ROS  As per HPI. Comprehensive ROS deferred due to acuity of presentation.   Past History   Past Medical History:  Diagnosis Date   Aneurysm (HCC)    bilateral opthalmic   Anxiety    Cerebral aneurysm    Hematuria    & probable OAB   Migraine     Past Surgical History:  Procedure Laterality Date   APPENDECTOMY     BRAIN SURGERY     BREAST BIOPSY Right 08/04/2023   US  RT BREAST BX W LOC DEV EA ADD LESION IMG BX SPEC US  GUIDE 08/04/2023 [facility]   BREAST BIOPSY  Right 08/04/2023   US  RT BREAST BX W LOC DEV 1ST LESION IMG BX SPEC US  GUIDE 08/04/2023 [facility]   CHOLECYSTECTOMY      Family History: Family History  Problem Relation Age of Onset   Sarcoidosis Mother    Migraines Mother    Depression Mother     Social History  reports that she has never smoked. She has never used smokeless tobacco. She reports that she does not drink alcohol and does not use drugs.  Allergies  Allergen Reactions   Ultram [Tramadol Hcl] Hives and Nausea And Vomiting   Ivp Dye [Iodinated Contrast Media] Itching and Nausea And Vomiting   Percocet [Oxycodone-Acetaminophen ] Hives and Itching   Pyridium Plus [Phenazopyridine-Butabarb-Hyosc]     Rash    Zomig [Zolmitriptan] Other (See Comments)    Chest pain, palpitations, shortness of breath, bright red rash across cxhest, tightness of chest   Codeine Itching, Nausea And Vomiting and Rash   Ditropan [Oxybutynin Chloride] Rash   Hydrocodone Itching,  Nausea And Vomiting and Rash    Medications  No current facility-administered medications for this encounter.  Current Outpatient Medications:    amoxicillin -clavulanate (AUGMENTIN ) 875-125 MG tablet, Take 1 tablet by mouth every 12 (twelve) hours. (Patient not taking: Reported on 06/13/2021), Disp: 14 tablet, Rfl: 0   ASPIRIN 81 PO, Take by mouth. (Patient not taking: Reported on 06/13/2021), Disp: , Rfl:    beclomethasone (QVAR) 40 MCG/ACT inhaler, 1 puff, Disp: , Rfl:    diazepam  (VALIUM ) 2 MG tablet, Take one tablet one hour prior to MRI 03/14/2020. Take one additional tablet thirty minutes later if desired effects not achieved. Do not drive self while taking this medication., Disp: 2 tablet, Rfl: 0   Elagolix Sodium (ORILISSA) 150 MG TABS, Orilissa 150 mg tablet  TAKE 1 TABLET BY MOUTH EVERY DAY, Disp: , Rfl:    Elagolix Sodium 150 MG TABS, 1 tablet (Patient not taking: Reported on 06/13/2021), Disp: , Rfl:    eletriptan (RELPAX) 40 MG tablet, Take 40 mg  by mouth as needed for migraine or headache. May repeat in 2 hours if headache persists or recurs., Disp: , Rfl:    Fexofenadine HCl (ALLEGRA PO), Take by mouth. (Patient not taking: Reported on 06/13/2021), Disp: , Rfl:    ipratropium (ATROVENT ) 0.03 % nasal spray, Place 2 sprays into both nostrils 2 (two) times daily., Disp: 30 mL, Rfl: 0   lamoTRIgine (LAMICTAL) 150 MG tablet, Take 150 mg by mouth 2 (two) times daily., Disp: , Rfl:    lamoTRIgine (LAMICTAL) 150 MG tablet, Take by mouth., Disp: , Rfl:    leuprolide (LUPRON) 11.25 MG injection, Inject 11.25 mg into the muscle every 3 (three) months. (Patient not taking: Reported on 06/13/2021), Disp: , Rfl:    lurasidone (LATUDA) 40 MG TABS tablet, Latuda 40 mg tablet, Disp: , Rfl:    Melatonin 3 MG TABS, Take 3 mg by mouth at bedtime. , Disp: , Rfl:    omeprazole (PRILOSEC) 20 MG capsule, Take 20 mg by mouth at bedtime., Disp: , Rfl:    ondansetron  (ZOFRAN ) 4 MG tablet, Take 4 mg by mouth every 6 (six) hours as needed for nausea or vomiting., Disp: , Rfl:    OVER THE COUNTER MEDICATION, Apply 1 application topically 4 (four) times a week. Essential oils, Disp: , Rfl:    promethazine -dextromethorphan (PROMETHAZINE -DM) 6.25-15 MG/5ML syrup, Take 5 mLs by mouth 4 (four) times daily as needed for cough. (Patient not taking: Reported on 06/13/2021), Disp: 118 mL, Rfl: 0   propranolol (INDERAL) 20 MG tablet, TAKE 3-4 TABS NIGHTLY AT BEDTIME AS TOLERATED., Disp: , Rfl:    QUEtiapine (SEROQUEL XR) 300 MG 24 hr tablet, Take 300 mg by mouth daily., Disp: , Rfl:    QUEtiapine (SEROQUEL) 100 MG tablet, Take 100 mg by mouth at bedtime., Disp: , Rfl:    SUMAtriptan (IMITREX) 100 MG tablet, Take 100 mg by mouth every 2 (two) hours as needed for migraine or headache. May repeat in 2 hours if headache persists or recurs., Disp: , Rfl:    zonisamide (ZONEGRAN) 100 MG capsule, Take 600 mg by mouth at bedtime.  (Patient not taking: Reported on 06/13/2021), Disp: ,  Rfl:   Vitals   Vitals:   12/21/23 1315 12/21/23 1317  BP: 120/77   Pulse: 100   Resp: 18   Temp: 98.4 F (36.9 C)   SpO2: 99%   Weight:  106.1 kg  Height:  5\' 3"  (1.6 m)    Body mass  index is 41.45 kg/m.  Physical Exam   Physical Exam  HEENT:  [place]/AT Lungs: Respirations unlabored Extremities: Warm and well perfused.   Neurological Examination Mental Status: Awake, alert and oriented. Speech fluent with intact naming and comprehension.  Cranial Nerves: II: Visual fields intact bilaterally in the context of subjective visual blurring in upper field of left eye and in right upper quadrant of right eye. No extinction to DSS. III,IV, VI: No ptosis. EOMI. No nystagmus.  V: Sensation equal bilaterally VII: Smile symmetric VIII: Hearing intact to conversation IX,X: No hoarseness or hypophonia XI: Symmetric XII: Midline tongue  Motor: Right : Upper extremity   5/5    Left:     Upper extremity   5/5  Lower extremity   5/5     Lower extremity   5/5 No pronator drift Sensory: Light touch intact throughout, bilaterally Cerebellar: No ataxia with FNF bilaterally Gait: Deferred   Labs/Imaging/Neurodiagnostic studies   CBC:  Recent Labs  Lab Jan 12, 2024 1334 2024/01/12 1342  WBC 8.2  --   NEUTROABS 5.7  --   HGB 13.0 13.6  HCT 40.5 40.0  MCV 88.4  --   PLT 326  --    Basic Metabolic Panel:  Lab Results  Component Value Date   NA 140 01-12-2024   K 3.9 2024/01/12   CO2 25 12/17/2022   GLUCOSE 106 (H) 2024/01/12   BUN 17 Jan 12, 2024   CREATININE 1.00 2024-01-12   CALCIUM 8.8 (L) 12/17/2022   GFRNONAA >60 12/17/2022   GFRAA >60 04/11/2017   Lipid Panel: No results found for: "LDLCALC" HgbA1c: No results found for: "HGBA1C" Urine Drug Screen:     Component Value Date/Time   LABOPIA NONE DETECTED 12/17/2022 1859   COCAINSCRNUR NONE DETECTED 12/17/2022 1859   LABBENZ NONE DETECTED 12/17/2022 1859   AMPHETMU NONE DETECTED 12/17/2022 1859   THCU NONE DETECTED  12/17/2022 1859   LABBARB NONE DETECTED 12/17/2022 1859    Alcohol Level     Component Value Date/Time   ETH <10 12/17/2022 1859   INR  Lab Results  Component Value Date   INR 1.1 12/17/2022   APTT  Lab Results  Component Value Date   APTT 31 12/17/2022   4-vessel angiogram report conclusions from prior interventional procedure (10/10/11):  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   ASSESSMENT  48 y.o. 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 Code Stroke called in Triage.  - Exam is nonfocal. NIHSS 0.  - CT head: No evidence of acute intracranial abnormality. ASPECTS of 10.  - Was prescribed with ASA at home but not currently compliant with this. This needs to be resumed and continued indefinitely due to her intracranial stents.  - On Lamictal at home for mood control.  - Was formerly on Zonegran for migraine prophylaxis. This has been stopped.  - Started recently on Ajovy (a CGRP antagonist) for migraine prophylaxis.  - Most likely component of the DDx is complicated migraine. Significantly lower on the DDx would be an acute occipital lobe stroke.   RECOMMENDATIONS  - MRI brain with MRA of head (ordered). Discussed imaging options with Dr. Alvira Josephs.  - Lights off in room to prevent worsening  headache.  - If headache worsens then would administer Compazine  10 mg IV x 1 - ASA 81 mg po every day.  - Further recommendations following MRI ______________________________________________________________________    Hope Ly, Danajah Birdsell, MD [facility]

## 2023-12-21 NOTE — ED Triage Notes (Signed)
 Patient reports had a similar episode Saturday where she just didn't feel right and BP got high and developed a headache. Patient reports hx of complex migraines as well as 2 brain anuersyms that were clipped about 2 years go.  Denies focal neuro symptoms but does report floaters.  Patient complains of pain in right side of neck "a tweng".

## 2023-12-21 NOTE — ED Notes (Signed)
 Patient transported to MRI

## 2023-12-21 NOTE — ED Provider Triage Note (Signed)
 Emergency Medicine Provider Triage Evaluation Note  Katherine [name] , a 48 y.o. female  was evaluated in triage.  Pt complains of racing heart, lightheadedness at work today at 12:30, went to nurse's office, had vision "waves" and was sent to ED. Hx of brain aneurysm, migraines. No current headache. C/o blurry vision. No weakness on one side of body. Occurred 2 days ago, with similar symptoms but resolved after an hour, but then got migraine. 2 days ago, had drooping with migraine. Reports pain in R side of neck.   Review of Systems  Positive: headache Negative: N/V  Physical Exam  BP 120/77 (BP Location: Right Arm)   Pulse 100   Temp 98.4 F (36.9 C)   Resp 18   Ht 5\' 3"  (1.6 m)   Wt 106.1 kg   SpO2 99%   BMI 41.45 kg/m  Gen:   Awake, no distress   Resp:  Normal effort  MSK:   Moves extremities without difficulty  Other:  Manually has to close L eye with finger, no droop on exam, no weakness other than L eye weakness  Medical Decision Making  Medically screening exam initiated at 1:26 PM.  Appropriate orders placed.  Katherine [name] was informed that the remainder of the evaluation will be completed by another provider, this initial triage assessment does not replace that evaluation, and the importance of remaining in the ED until their evaluation is complete.  Code stroke given difficulty closing L eye and blurry vision starting at 12:30   Katherine [name], [place] 12/21/23 1335

## 2023-12-21 NOTE — ED Provider Notes (Signed)
 Care was taken over from Dr. Inga Manges.  Patient had presented with some vision changes and difficulty closing her eye.  Code stroke was activated.  She had head CT which did not show any acute abnormality.  MRIs do not show any evidence of acute stroke or vessel occlusion/dissection.  Dr. Dietrich Fragmin has evaluated the patient and reviewed the images and feel that her symptoms are most likely consistent with a complex migraine.  She did take one of her migraine medications prior to arrival.  She does not want anything further for her migraine.  She was discharged home in good condition.  Will follow-up with her doctor.  Return precautions were given.   Hershel Los, MD 12/21/23 [phone]

## 2023-12-21 NOTE — ED Provider Notes (Signed)
 [place] EMERGENCY DEPARTMENT AT [facility] Provider Note   CSN: [id number] Arrival date & time: 12/21/23  1307  An emergency department physician performed an initial assessment on this suspected stroke patient at 50.  History  Chief Complaint  Patient presents with   Hypertension    Katherine [name] is a 48 y.o. female.  48 yo F with a cc of change to vision, heart racing.  R sided neck pain.  Has some issue with L eye closing and made code stroke in triage.    Hypertension       Home Medications Prior to Admission medications   Medication Sig Start Date End Date Taking? Authorizing Provider  amoxicillin -clavulanate (AUGMENTIN ) 875-125 MG tablet Take 1 tablet by mouth every 12 (twelve) hours. Patient not taking: Reported on 06/13/2021 08/16/20   Graham, Laura E, PA-C  ASPIRIN 81 PO Take by mouth. Patient not taking: Reported on 06/13/2021    [provider]  beclomethasone (QVAR) 40 MCG/ACT inhaler 1 puff    [provider]  diazepam  (VALIUM ) 2 MG tablet Take one tablet one hour prior to MRI 03/14/2020. Take one additional tablet thirty minutes later if desired effects not achieved. Do not drive self while taking this medication. 03/14/20   Louk, Darnell Elbe, PA-C  Elagolix Sodium (ORILISSA) 150 MG TABS Orilissa 150 mg tablet  TAKE 1 TABLET BY MOUTH EVERY DAY    [provider]  Elagolix Sodium 150 MG TABS 1 tablet Patient not taking: Reported on 06/13/2021    [provider]  eletriptan (RELPAX) 40 MG tablet Take 40 mg by mouth as needed for migraine or headache. May repeat in 2 hours if headache persists or recurs.    [provider]  Fexofenadine HCl (ALLEGRA PO) Take by mouth. Patient not taking: Reported on 06/13/2021    [provider]  ipratropium (ATROVENT ) 0.03 % nasal spray Place 2 sprays into both nostrils 2 (two) times daily. 06/13/21   Adolph Hoop, PA-C  lamoTRIgine (LAMICTAL) 150 MG tablet  Take 150 mg by mouth 2 (two) times daily.    [provider]  lamoTRIgine (LAMICTAL) 150 MG tablet Take by mouth.    [provider]  leuprolide (LUPRON) 11.25 MG injection Inject 11.25 mg into the muscle every 3 (three) months. Patient not taking: Reported on 06/13/2021    [provider]  lurasidone (LATUDA) 40 MG TABS tablet Latuda 40 mg tablet    [provider]  Melatonin 3 MG TABS Take 3 mg by mouth at bedtime.     [provider]  omeprazole (PRILOSEC) 20 MG capsule Take 20 mg by mouth at bedtime.    [provider]  ondansetron  (ZOFRAN ) 4 MG tablet Take 4 mg by mouth every 6 (six) hours as needed for nausea or vomiting.    [provider]  OVER THE COUNTER MEDICATION Apply 1 application topically 4 (four) times a week. Essential oils    [provider]  promethazine -dextromethorphan (PROMETHAZINE -DM) 6.25-15 MG/5ML syrup Take 5 mLs by mouth 4 (four) times daily as needed for cough. Patient not taking: Reported on 06/13/2021 08/16/20   Graham, Laura E, PA-C  propranolol (INDERAL) 20 MG tablet TAKE 3-4 TABS NIGHTLY AT BEDTIME AS TOLERATED. 03/13/21   [provider]  QUEtiapine (SEROQUEL XR) 300 MG 24 hr tablet Take 300 mg by mouth daily.    [provider]  QUEtiapine (SEROQUEL) 100 MG tablet Take 100 mg by mouth at bedtime.  [provider]  SUMAtriptan (IMITREX) 100 MG tablet Take 100 mg by mouth every 2 (two) hours as needed for migraine or headache. May repeat in 2 hours if headache persists or recurs.    [provider]  zonisamide (ZONEGRAN) 100 MG capsule Take 600 mg by mouth at bedtime.  Patient not taking: Reported on 06/13/2021    [provider]      Allergies    Ultram [tramadol hcl], Ivp dye [iodinated contrast media], Percocet [oxycodone-acetaminophen ], Pyridium plus [phenazopyridine-butabarb-hyosc], Zomig [zolmitriptan], Codeine, Ditropan [oxybutynin  chloride], and Hydrocodone    Review of Systems   Review of Systems  Physical Exam Updated Vital Signs BP 112/67   Pulse 73   Temp 98.4 F (36.9 C)   Resp 15   Ht 5\' 3"  (1.6 m)   Wt 106.1 kg   SpO2 100%   BMI 41.45 kg/m  Physical Exam Vitals and nursing note reviewed.  Constitutional:      General: She is not in acute distress.    Appearance: She is well-developed. She is not diaphoretic.  HENT:     Head: Normocephalic and atraumatic.  Eyes:     Pupils: Pupils are equal, round, and reactive to light.  Cardiovascular:     Rate and Rhythm: Normal rate and regular rhythm.     Heart sounds: No murmur heard.    No friction rub. No gallop.  Pulmonary:     Effort: Pulmonary effort is normal.     Breath sounds: No wheezing or rales.  Abdominal:     General: There is no distension.     Palpations: Abdomen is soft.     Tenderness: There is no abdominal tenderness.  Musculoskeletal:        General: No tenderness.     Cervical back: Normal range of motion and neck supple.  Skin:    General: Skin is warm and dry.  Neurological:     Mental Status: She is alert and oriented to person, place, and time.  Psychiatric:        Behavior: Behavior normal.     ED Results / Procedures / Treatments   Labs (all labs ordered are listed, but only abnormal results are displayed) Labs Reviewed  COMPREHENSIVE METABOLIC PANEL WITH GFR - Abnormal; Notable for the following components:      Result Value   Glucose, Bld 109 (*)    Creatinine, Ser 1.05 (*)    All other components within normal limits  I-STAT CHEM 8, ED - Abnormal; Notable for the following components:   Glucose, Bld 106 (*)    Calcium, Ion 1.14 (*)    All other components within normal limits  CBG MONITORING, ED - Abnormal; Notable for the following components:   Glucose-Capillary 107 (*)    All other components within normal limits  ETHANOL  PROTIME-INR  APTT  CBC  DIFFERENTIAL  RAPID URINE DRUG SCREEN, HOSP  PERFORMED  HCG, SERUM, QUALITATIVE    EKG None  Radiology CT HEAD CODE STROKE WO CONTRAST Result Date: 12/21/2023 CLINICAL DATA:  Code stroke. Neuro deficit, acute, stroke suspected. Blurred vision. EXAM: CT HEAD WITHOUT CONTRAST TECHNIQUE: Contiguous axial images were obtained from the base of the skull through the vertex without intravenous contrast. RADIATION DOSE REDUCTION: This exam was performed according to the departmental dose-optimization program which includes automated exposure control, adjustment of the mA and/or kV according to patient size and/or use of iterative reconstruction technique. COMPARISON:  Head CT 12/17/2022 FINDINGS: Brain: There is  no evidence of an acute infarct, intracranial hemorrhage, mass, midline shift, or extra-axial fluid collection. Cerebral volume is normal. The ventricles are normal in size. Vascular: Previous endovascular treatment of bilateral ICA aneurysms via stent assisted coiling with associated streak artifact. No acutely suspicious vessel hyperdensity. Skull: No fracture or suspicious lesion. Sinuses/Orbits: Minimal mucosal thickening in the included portions of the paranasal sinuses. Clear mastoid air cells. Right cataract extraction. Other: None. ASPECTS ([place] Stroke Program Early CT Score) - Ganglionic level infarction (caudate, lentiform nuclei, internal capsule, insula, M1-M3 cortex): 7 - Supraganglionic infarction (M4-M6 cortex): 3 Total score (0-10 with 10 being normal): 10 These results were communicated to Dr. Lindzen at 1:55 pm on 12/21/2023 by text page via the [facility] messaging system. IMPRESSION: No evidence of acute intracranial abnormality. ASPECTS of 10. Electronically Signed   By: Aundra Lee M.D.   On: 12/21/2023 13:55    Procedures Procedures    Medications Ordered in ED Medications  LORazepam  (ATIVAN ) injection 2 mg (has no administration in time range)  aspirin EC tablet 81 mg (81 mg Oral Given 12/21/23 1513)    ED Course/  Medical Decision Making/ A&P                                 Medical Decision Making Risk Prescription drug management.   48 yo F with a cc of acute vision change, found to have difficulty with eye closure.  Made code stroke in triage.   Patient is now feeling a bit better.  She does feel like she is starting to have a migraine.  She did take Nurtec.  Neurology plan to get an MRI/MRA.  Patient care signed out to Dr. Nolia Baumgartner, please see their note for further details of care in the ED.  The patients results and plan were reviewed and discussed.   Any x-rays performed were independently reviewed by myself.   Differential diagnosis were considered with the presenting HPI.  Medications  LORazepam  (ATIVAN ) injection 2 mg (has no administration in time range)  aspirin EC tablet 81 mg (81 mg Oral Given 12/21/23 1513)    Vitals:   12/21/23 1315 12/21/23 1317 12/21/23 1434 12/21/23 1436  BP: 120/77  112/67   Pulse: 100   73  Resp: 18   15  Temp: 98.4 F (36.9 C)     SpO2: 99%   100%  Weight:  106.1 kg    Height:  5\' 3"  (1.6 m)      Final diagnoses:  Occipital headache  Vision changes           Final Clinical Impression(s) / ED Diagnoses Final diagnoses:  Occipital headache  Vision changes    Rx / DC Orders ED Discharge Orders     None         Albertus Hughs, DO 12/21/23 1537

## 2023-12-21 NOTE — ED Notes (Signed)
 Code stroke paged out per Dr. Tegeler

## 2023-12-21 NOTE — Code Documentation (Signed)
 Stroke Response Nurse Documentation Code Documentation  Marca D Glovier is a 48 y.o. female arriving to [place]  via Private Vehicle on 4/28 with past medical hx of ophthalmic artery aneurysms, complicated migraines, and anxiety. On No antithrombotic. Code stroke was activated by ED.   Patient from work where she was LKW at 1230 at which time she developed blurred vision and difficulty closing her L eye, accompanied by tachycardia and feeling like she might pass out.    Stroke team at the bedside on code stroke activation. Labs drawn and patient cleared for CT by Dr. Manus Sellers. Patient to CT with team. NIHSS 1, improving to 0 through duration of exam. See documentation for details and code stroke times. Patient with right decreased sensation on initial exam. The following imaging was completed:  CT Head. Patient is not a candidate for IV Thrombolytic due to stroke not suspected per MD. Patient is not a candidate for IR due to LVO not suspected per MD.   Care Plan: q30 min NIHSS and vitals until 1700, then q 2.   Bedside handoff with ED RN Deeann Fare.    Marlon Simpson  Stroke Response RN

## 2024-02-19 ENCOUNTER — Encounter (HOSPITAL_COMMUNITY): Payer: Self-pay | Admitting: Interventional Radiology

## 2024-04-08 ENCOUNTER — Ambulatory Visit: Admitting: Cardiology
